# Patient Record
Sex: Female | Born: 1955 | Race: White | Hispanic: No | Smoking: Former smoker
Health system: Southern US, Community
[De-identification: ages and names within clinical notes are randomized; demographics above are authoritative.]

## PROBLEM LIST (undated history)

## (undated) DIAGNOSIS — N179 Acute kidney failure, unspecified: Secondary | ICD-10-CM

## (undated) DIAGNOSIS — I5031 Acute diastolic (congestive) heart failure: Secondary | ICD-10-CM

## (undated) DIAGNOSIS — R001 Bradycardia, unspecified: Secondary | ICD-10-CM

## (undated) DIAGNOSIS — I10 Essential (primary) hypertension: Secondary | ICD-10-CM

## (undated) DIAGNOSIS — F32A Depression, unspecified: Secondary | ICD-10-CM

## (undated) DIAGNOSIS — I4891 Unspecified atrial fibrillation: Secondary | ICD-10-CM

## (undated) DIAGNOSIS — F329 Major depressive disorder, single episode, unspecified: Secondary | ICD-10-CM

## (undated) HISTORY — DX: Unspecified atrial fibrillation: I48.91

## (undated) HISTORY — PX: BREAST LUMPECTOMY: SHX2

## (undated) HISTORY — PX: ABDOMINAL HYSTERECTOMY: SHX81

## (undated) HISTORY — PX: PACEMAKER IMPLANT: EP1218

## (undated) HISTORY — DX: Essential (primary) hypertension: I10

## (undated) HISTORY — DX: Acute diastolic (congestive) heart failure: I50.31

## (undated) HISTORY — DX: Major depressive disorder, single episode, unspecified: F32.9

## (undated) HISTORY — DX: Acute kidney failure, unspecified: N17.9

## (undated) HISTORY — DX: Bradycardia, unspecified: R00.1

## (undated) HISTORY — DX: Depression, unspecified: F32.A

---

## 2004-09-30 ENCOUNTER — Encounter: Admission: RE | Admit: 2004-09-30 | Discharge: 2004-09-30 | Payer: Self-pay | Admitting: Family Medicine

## 2010-05-04 DIAGNOSIS — I4891 Unspecified atrial fibrillation: Secondary | ICD-10-CM

## 2010-05-04 HISTORY — DX: Unspecified atrial fibrillation: I48.91

## 2010-05-25 ENCOUNTER — Encounter: Payer: Self-pay | Admitting: Family Medicine

## 2010-09-14 ENCOUNTER — Emergency Department (HOSPITAL_COMMUNITY)

## 2010-09-14 ENCOUNTER — Observation Stay (HOSPITAL_COMMUNITY)
Admission: AD | Admit: 2010-09-14 | Discharge: 2010-09-15 | Disposition: A | Discharge status: Home / Self Care | Source: Ambulatory Visit | Attending: Internal Medicine | Admitting: Internal Medicine

## 2010-09-14 DIAGNOSIS — I4891 Unspecified atrial fibrillation: Secondary | ICD-10-CM | POA: Insufficient documentation

## 2010-09-14 DIAGNOSIS — F329 Major depressive disorder, single episode, unspecified: Secondary | ICD-10-CM | POA: Insufficient documentation

## 2010-09-14 DIAGNOSIS — I498 Other specified cardiac arrhythmias: Secondary | ICD-10-CM | POA: Insufficient documentation

## 2010-09-14 DIAGNOSIS — R0789 Other chest pain: Principal | ICD-10-CM | POA: Insufficient documentation

## 2010-09-14 DIAGNOSIS — N179 Acute kidney failure, unspecified: Secondary | ICD-10-CM | POA: Insufficient documentation

## 2010-09-14 DIAGNOSIS — Z79899 Other long term (current) drug therapy: Secondary | ICD-10-CM | POA: Insufficient documentation

## 2010-09-14 DIAGNOSIS — R110 Nausea: Secondary | ICD-10-CM | POA: Insufficient documentation

## 2010-09-14 DIAGNOSIS — F3289 Other specified depressive episodes: Secondary | ICD-10-CM | POA: Insufficient documentation

## 2010-09-14 DIAGNOSIS — R0609 Other forms of dyspnea: Secondary | ICD-10-CM | POA: Insufficient documentation

## 2010-09-14 DIAGNOSIS — R0989 Other specified symptoms and signs involving the circulatory and respiratory systems: Secondary | ICD-10-CM | POA: Insufficient documentation

## 2010-09-14 LAB — PROTIME-INR
INR: 2.44 — ABNORMAL HIGH (ref 0.00–1.49)
Prothrombin Time: 26.6 seconds — ABNORMAL HIGH (ref 11.6–15.2)

## 2010-09-14 LAB — CBC
MCV: 94.8 fL (ref 78.0–100.0)
Platelets: 259 10*3/uL (ref 150–400)
RDW: 17.9 % — ABNORMAL HIGH (ref 11.5–15.5)
WBC: 8.2 10*3/uL (ref 4.0–10.5)

## 2010-09-14 LAB — APTT: aPTT: 37 seconds (ref 24–37)

## 2010-09-14 LAB — DIFFERENTIAL
Basophils Absolute: 0.1 10*3/uL (ref 0.0–0.1)
Basophils Relative: 1 % (ref 0–1)
Eosinophils Relative: 2 % (ref 0–5)
Lymphs Abs: 4.1 10*3/uL — ABNORMAL HIGH (ref 0.7–4.0)
Monocytes Relative: 7 % (ref 3–12)

## 2010-09-14 LAB — POCT CARDIAC MARKERS
CKMB, poc: 5.1 ng/mL (ref 1.0–8.0)
Troponin i, poc: <0.05 ng/mL (ref 0.00–0.09)

## 2010-09-14 LAB — PRO B NATRIURETIC PEPTIDE: Pro B Natriuretic peptide (BNP): 680.2 pg/mL — ABNORMAL HIGH (ref 0–125)

## 2010-09-15 DIAGNOSIS — I517 Cardiomegaly: Secondary | ICD-10-CM

## 2010-09-15 DIAGNOSIS — R079 Chest pain, unspecified: Secondary | ICD-10-CM

## 2010-09-15 DIAGNOSIS — I4891 Unspecified atrial fibrillation: Secondary | ICD-10-CM

## 2010-09-15 LAB — COMPREHENSIVE METABOLIC PANEL
ALT: 24 U/L (ref 0–35)
Albumin: 3.4 g/dL — ABNORMAL LOW (ref 3.5–5.2)
Alkaline Phosphatase: 54 U/L (ref 39–117)
Chloride: 105 mEq/L (ref 96–112)
Creatinine, Ser: 0.96 mg/dL (ref 0.4–1.2)
GFR calc non Af Amer: >60 mL/min (ref >60)
Potassium: 3.9 mEq/L (ref 3.5–5.1)
Sodium: 137 mEq/L (ref 135–145)
Total Bilirubin: 0.3 mg/dL (ref 0.3–1.2)
Total Protein: 6 g/dL (ref 6.0–8.3)

## 2010-09-15 LAB — BASIC METABOLIC PANEL
BUN: 24 mg/dL — ABNORMAL HIGH (ref 6–23)
Creatinine, Ser: 1.41 mg/dL — ABNORMAL HIGH (ref 0.4–1.2)
GFR calc Af Amer: 47 mL/min — ABNORMAL LOW (ref >60)
GFR calc non Af Amer: 39 mL/min — ABNORMAL LOW (ref >60)
Glucose, Bld: 125 mg/dL — ABNORMAL HIGH (ref 70–99)

## 2010-09-15 LAB — CARDIAC PANEL(CRET KIN+CKTOT+MB+TROPI)
CK, MB: 6.5 ng/mL (ref 0.3–4.0)
Total CK: 176 U/L (ref 7–177)
Troponin I: <0.3 ng/mL (ref <0.30)
Troponin I: <0.3 ng/mL (ref <0.30)

## 2010-09-15 LAB — LIPID PANEL
Cholesterol: 259 mg/dL — ABNORMAL HIGH (ref 0–200)
HDL: 77 mg/dL (ref >39)
LDL Cholesterol: 110 mg/dL — ABNORMAL HIGH (ref 0–99)
Total CHOL/HDL Ratio: 3.4 RATIO

## 2010-09-15 LAB — PROTIME-INR
INR: 3.03 — ABNORMAL HIGH (ref 0.00–1.49)
Prothrombin Time: 31.4 seconds — ABNORMAL HIGH (ref 11.6–15.2)

## 2010-09-15 LAB — T4, FREE: Free T4: 1.36 ng/dL (ref 0.80–1.80)

## 2010-09-15 LAB — TROPONIN I: Troponin I: <0.3 ng/mL (ref <0.30)

## 2010-09-15 LAB — T3, FREE: T3, Free: 2.7 pg/mL (ref 2.3–4.2)

## 2010-09-16 ENCOUNTER — Encounter: Payer: Self-pay | Admitting: Cardiology

## 2010-09-16 NOTE — Discharge Summary (Signed)
NAMESHAMARIE, CALL             ACCOUNT NO.:  [id number]  MEDICAL RECORD NO.:  [id number]           PATIENT TYPE:  O  LOCATION:  IC02                          FACILITY:  [facility]  PHYSICIAN:  Hillery Aldo, M.D.   DATE OF BIRTH:  06-13-1955  DATE OF ADMISSION:  09/14/2010 DATE OF DISCHARGE:  05/14/2012LH                              DISCHARGE SUMMARY   PRIMARY CARE PHYSICIAN:  Birdena Jubilee, MD  CARDIOLOGIST:  Dr. Chales Abrahams in [facility].  SECONDARY CARDIOLOGIST:  Gerrit Friends. Dietrich Pates, MD, [facility]  DISCHARGE DIAGNOSES: 1. Chest pain secondary to bradycardia. 2. History of paroxysmal atrial fibrillation. 3. Acute renal failure. 4. Hypotension. 5. History of hypertension. 6. Probable diastolic congestive heart failure precipitated by low     heart rate. 7. Depression.  DISCHARGE MEDICATIONS: 1. Warfarin 1 mg p.o. daily. 2. Artificial tears 1 drop in both eyes daily. 3. Enteric-coated aspirin 81 mg p.o. daily. 4. Cetirizine 10 mg p.o. daily p.r.n. allergy symptoms. 5. Lisinopril 10 mg p.o. daily. 6. Pristiq XR 50 mg p.o. daily.  Note:  The patient was instructed to discontinue diltiazem, amiodarone, and metoprolol.  CONSULTATIONS:  Dr. Hidalgo Bing of Cardiology.  BRIEF ADMISSION HISTORY OF PRESENT ILLNESS:  The patient is a 55 year old female with past medical history of paroxysmal atrial fibrillation diagnosed in January of this year who presented to the hospital with chest pain.  Pain came on when she was at rest and radiated up into the throat and jaw area.  There was some associated dyspnea, nausea, and emesis.  The pain was relieved by morphine.  According to the patient's husband, he took her pulse rate when she was having pain and it was in the 30s.  Upon initial evaluation in the emergency department, she was found to be in sinus bradycardia and there were some reports of a junctional rhythm by EMS.  She subsequently was referred to the hospitalist service for  further evaluation and treatment.  For the full details, please see the dictated report by Dr. Rito Ehrlich.  PROCEDURES AND DIAGNOSTIC STUDIES: 1. Chest x-ray on Sep 14, 2010, showed no active cardiopulmonary     disease. 2. Two-dimensional echocardiogram done on Sep 15, 2010:  Results     pending at the time of this dictation.  DISCHARGE LABORATORY VALUES:  Cardiac markers showed a nonspecific MB elevation.  The relative index was not greater than 5 and troponins were negative.  Sodium was 137, potassium 3.9, chloride 105, bicarb 26, BUN 20, creatinine 0.96, and glucose 113.  Total bilirubin 0.3, alkaline phosphatase 54, AST 22, ALT 24, total protein 6.0, albumin 3.4, and calcium 8.9.  PT was 31.4 with an INR of 3.03.  Pro BNP was 680.2.  HOSPITAL COURSE BY PROBLEM: 1. Chest pain secondary to bradycardia:  The patient's chest pain     episode appears to be due to bradycardia with possible transient     cardiac ischemia.  This was most likely induced by being on     multiple rate-controlling medications including calcium channel    blocker and a beta-blocker.  The patient was monitored closely on  telemetry and with holding her rate-controlling medications, her     heart rate improved into the 50s and 60s.  Cardiology had been     consulted by the admitting physician.  Dr. Dietrich Pates saw the patient     the morning after admission and cleared her for discharge with     recommendations to discontinue metoprolol, Cardizem, and     amiodarone.  He felt that she may not need further Coumadin if she     has a candidate for radiofrequency ablation and this would be     considered post discharge.  She was maintained on aspirin therapy.     At this point is chest pain-free. 2. History of paroxysmal atrial fibrillation:  The patient has been in     normal sinus rhythm and sinus bradycardia throughout her hospital     stay.  She was fully anticoagulated again, will follow up with     Cardiology  to determine for consideration of radiofrequency     ablation and discontinuation of Coumadin. 3. Acute renal failure, which was felt to be prerenal and her ACE     inhibitor was held.  Once her blood pressure improved, her     creatinine stabilized and was 0.96 at discharge. 4. Hypertension/hypotension:  The patient does have a history of     hypertension but was actually hypotensive on admission.  Several of     her medications have been discontinued and her blood pressure has     improved. 5. Probable diastolic congestive heart failure:  The patient's pro BNP     was elevated at 680 and she was transiently short of breath.  Her     chest x-ray was clear.  It is most likely diastolic heart failure     precipitated by low heart rate.  She is compensated now. 6. Depression:  The patient can continue Pristiq post discharge.  DISPOSITION:  The patient is medically stable and has been cleared for discharge by the cardiologist.  CONDITION AT DISCHARGE:  Improved.  DISCHARGE DIET:  Heart healthy.  DISCHARGE INSTRUCTIONS:  Follow up with Dr. Dietrich Pates in 1 week.  Follow up with your primary care physician for ongoing Coumadin management with a repeat PT/INR in the next 2-3 days.  Time spent coordinating care for discharge and discharge instructions including face-to-face time equals approximately 35 minutes.     Hillery Aldo, M.D.     CR/MEDQ  D:  09/15/2010  T:  09/16/2010  Job:  [id number]  cc:   Birdena Jubilee, MD Fax: [phone]  Dr. Ida Rogue  Gerrit Friends. Dietrich Pates, MD, [facility] [address] [place], [place] [address]  Electronically Signed by Hillery Aldo M.D. on 09/16/2010 04:42:07 PM

## 2010-09-19 ENCOUNTER — Encounter: Payer: Self-pay | Admitting: Cardiology

## 2010-09-22 ENCOUNTER — Ambulatory Visit (INDEPENDENT_AMBULATORY_CARE_PROVIDER_SITE_OTHER): Payer: Self-pay | Admitting: Adult Health

## 2010-09-22 ENCOUNTER — Encounter: Payer: Self-pay | Admitting: Adult Health

## 2010-09-22 VITALS — BP 134/84 | HR 71 | Wt 159.0 lb

## 2010-09-22 DIAGNOSIS — E7800 Pure hypercholesterolemia, unspecified: Secondary | ICD-10-CM | POA: Insufficient documentation

## 2010-09-22 DIAGNOSIS — I480 Paroxysmal atrial fibrillation: Secondary | ICD-10-CM | POA: Insufficient documentation

## 2010-09-22 DIAGNOSIS — I498 Other specified cardiac arrhythmias: Secondary | ICD-10-CM

## 2010-09-22 DIAGNOSIS — R001 Bradycardia, unspecified: Secondary | ICD-10-CM

## 2010-09-22 DIAGNOSIS — I4891 Unspecified atrial fibrillation: Secondary | ICD-10-CM

## 2010-09-22 DIAGNOSIS — I10 Essential (primary) hypertension: Secondary | ICD-10-CM

## 2010-09-22 MED ORDER — DILTIAZEM HCL ER COATED BEADS 120 MG PO CP24: 120.0000 mg | ORAL_CAPSULE | Freq: Every day | ORAL | Status: DC

## 2010-09-22 MED ORDER — DILTIAZEM HCL 120 MG PO TABS: 120.0000 mg | ORAL_TABLET | Freq: Every day | ORAL | Status: DC

## 2010-09-22 MED ORDER — LISINOPRIL 20 MG PO TABS: 20.0000 mg | ORAL_TABLET | Freq: Every day | ORAL | Status: DC

## 2010-09-22 NOTE — Patient Instructions (Signed)
Your physician is referring you to Dr. Myrna Blazer at [facility].  Your physician has recommended you make the following change in your medication: Start taking Diltiazem 120mg  daily and increase Lisinopril to 20mg  daily

## 2010-09-22 NOTE — Assessment & Plan Note (Signed)
At this time, she is well controlled. She will need close follow-up with BP management with addition of cardiazem and lisinopril dose changes.

## 2010-09-22 NOTE — Assessment & Plan Note (Signed)
She has been seen and examined by myself and by Dr. Dietrich Pates.  Unfortunately her insurance is not accepted by our practice-[employer].  She has received a letter that states Dr.Mihai Croitoru of [facility].  She is being referred there for continuation of her cardiac care,as she wishes to be treated locally and not return to [facility].    She will need to have EP evaluation once amiodarone is out of her system for evaluation of atrial fib and discussion of need to have ablation if necessary.  At this time she will be placed on cardiazem 120mg  daily, lisinopril will be increased to 20mg  daily.  She is to continue on coumadin as directed.  No BB at this time.  This has been discussed with the patient at length by Dr. Dietrich Pates.  She verbalizes understanding.

## 2010-09-22 NOTE — Progress Notes (Signed)
HPI: Patricia Sellers is a pleasant 55 y/o CF patient of Dr. Dietrich Pates who saw her orginally on consultation at [facility] for sinus bradycardia with history of paroxysmal atrial fib.  She is normally seen by Dr. Dyane Dustman in [facility] and has a history of PAF, hypertension, and depression.  She was on amiodarone, diltiazem, metoprolol and coumadin.  She was admitted with a HR of 40 with hypotension 80's over 40's.  During hospitalization, she was taken off of amiodarone, metoprolol and diltiazem.  HR improved off of these medications. She remains on coumadin and is followed by PCP Dr.Robyn Zanard for PT/INR.  She is basically asymptomatic since discharge, but continues some fatigue. She has had one or two bouts of fluttering in her heart, but nonsustained.  She denies chest pain or dizziness.    Allergies  Allergen Reactions  . Benzoyl Peroxide Rash    Current Outpatient Prescriptions  Medication Sig Dispense Refill  . aspirin 81 MG tablet Take 81 mg by mouth daily.        . Cetirizine HCl 10 MG CAPS Take 1 capsule by mouth daily.        Marland Kitchen desvenlafaxine (PRISTIQ) 50 MG 24 hr tablet Take 50 mg by mouth daily.        Marland Kitchen diltiazem (CARDIZEM CD) 120 MG 24 hr capsule Take 1 capsule (120 mg total) by mouth daily.  30 capsule  3  . diltiazem (CARDIZEM) 120 MG tablet Take 1 tablet (120 mg total) by mouth daily.  30 tablet  3  . hydroxypropyl methylcellulose (ISOPTO TEARS) 2.5 % ophthalmic solution Place into both eyes 3 (three) times daily.        Marland Kitchen lisinopril (PRINIVIL,ZESTRIL) 20 MG tablet Take 1 tablet (20 mg total) by mouth daily.  30 tablet  3  . warfarin (COUMADIN) 1 MG tablet Take 1 mg by mouth as directed.        Marland Kitchen DISCONTD: lisinopril (PRINIVIL,ZESTRIL) 10 MG tablet Take 10 mg by mouth daily.          Past Medical History  Diagnosis Date  . Atrial fibrillation 05/2010    Dr. Chales Abrahams at [facility]; Echo-mild to moderate MR, mild LVH,  moderate left atrial enlargement, atrial septal aneurysm,  negative contrast study, EF of 35%.normal chemistry profile, TSH, chest x-ray, cardiac markers, electrolytes and renal tests.  Normal cardiac catheterization except for depressed LV function .  A sleeve a  . Chest pain   . Bradycardia   . Acute renal failure   . Hypotension   . Hypertension   . Diastolic CHF, acute   . Depression     Past Surgical History  Procedure Date  . Abdominal hysterectomy   . Breast lumpectomy     benign    ZOX:[id number] of systems complete and found to be negative unless listed above PHYSICAL EXAM BP 134/84  Pulse 71  Wt 159 lb (72.122 kg) General: Well developed, well nourished, in no acute distress Head: Eyes PERRLA, No xanthomas.   Normal cephalic and atramatic  Lungs: Clear bilaterally to auscultation and percussion. Heart: HRRR S1 S2.  Pulses are 2+ & equal.            No carotid bruit. No JVD.  No abdominal bruits. No femoral bruits. Abdomen: Bowel sounds are positive, abdomen soft and non-tender without masses or                  Hernia's noted. Msk:  Back normal, normal gait.  Normal strength and tone for age. Extremities: No clubbing, cyanosis or edema.  DP +1 Neuro: Alert and oriented X 3. Psych:  Good affect, responds appropriately EKG: NSR with possible LAE.  Nonspecific ST and T wave abnormality.  Prolonged QT of 450 ms. ASSESSMENT AND PLAN

## 2010-10-09 NOTE — H&P (Signed)
NAMEALAZNE, Patricia Sellers             ACCOUNT NO.:  [id number]  MEDICAL RECORD NO.:  [id number]           PATIENT TYPE:  LOCATION:                                 FACILITY:  PHYSICIAN:  Osvaldo Shipper, MD     DATE OF BIRTH:  07-06-1955  DATE OF ADMISSION:  09/15/2010 DATE OF DISCHARGE:  LH                             HISTORY & PHYSICAL   PRIMARY CARE PHYSICIAN:  Birdena Jubilee, MD, located in [place].  PRIMARY CARDIOLOGIST:  Dr. Chales Abrahams in [facility].  ADMISSION DIAGNOSES: 1. Chest pain, rule out acute coronary syndrome. 2. History of atrial fibrillation, diagnosed in January. 3. Bradycardia, predominately sinus. 4. History of hypertension. 5. Dehydration with mild prerenal azotemia.  CHIEF COMPLAINT:  Chest pain.  HISTORY OF PRESENT ILLNESS:  The patient is a 56 year old Caucasian female who has a past medical history of AFib which was diagnosed in January who presented to the hospital with the complaints of chest pain which started at around 8:45 in the evening.  The patient was at rest when the pain started lying on the bed.  The pain was in the left side of the chest and it started radiating up to the throat and the jaw area. She got a little bit short of breath, had nausea, and then had two episodes of emesis.  The pain was a dull ache, tightness which was 8/10 in intensity.  After she received morphine, the pain has decreased to 0/10.  She denies any dizziness or lightheadedness.  No fever, no cough. She did feel a little bit hot and sweaty.  Most of the symptoms have resolved at this time.  She tells me that she was diagnosed with AFib in January, was admitted for a week at [facility].  She had a cardiac cath at that time which was clean.  It sounds like she also had an echocardiogram which showed enlarged heart, it is unclear if the heart squeezes well.  The plan is for possible cardioversion or a maze procedure or ablation sometime in the  future.  MEDICATIONS AT HOME: 1. Lisinopril 10 mg daily. 2. Diltiazem 240 mg daily. 3. Amiodarone 200 mg daily. 4. Coumadin 5 mg daily. 5. Metoprolol 50 mg twice daily. 6. Pristiq 50 mg daily. 7. Zyrtec as needed. 8. Aspirin 81 mg daily. 9. Tylenol Extra Strength as needed.  ALLERGIES:  BENZOYL PEROXIDE which causes a rash.  PAST MEDICAL HISTORY:  Positive for hypertension and recently diagnosed AFib as mentioned above.  SURGICAL HISTORY: 1. Hysterectomy. 2. Lumpectomy from her breast which was benign.  SOCIAL HISTORY:  Lives in [place] with her husband, recently moved here from [place].  Does not work, does not smoke cigarettes and drinks two drinks of Vodka Artic at least four times a week.  Her last consumption of alcohol was last night.  FAMILY HISTORY:  Father has a history of heart attack.  Mother has history of heart disease.  REVIEW OF SYSTEMS:  GENERAL:  Positive for weakness.  HEENT: Unremarkable.  CARDIOVASCULAR:  As in HPI.  RESPIRATORY:  As in HPI. GASTROINTESTINAL:  Unremarkable.  GENITOURINARY:  Unremarkable.  NEUROLOGIC:  Unremarkable.  PSYCHIATRIC:  Unremarkable.  DERMATOLOGIC: Unremarkable. Other systems reviewed and found to be negative.  PHYSICAL EXAMINATION:  VITAL SIGNS:  Temperature 97.4; blood pressures have been running soft in 90s over 50s, 80s over 40s; heart rate initially was in the 40s, currently in the 50s, sinus; respiratory rate is 16; saturation 100% on room air. GENERAL:  A slightly overweight white female, in no distress, slightly anxious. HEENT:  Head is normocephalic, atraumatic.  Pupils are equal, reacting. No pallor, no icterus.  Oral mucous membranes moist.  No oral lesions are noted. NECK:  Soft and supple.  No thyromegaly is appreciated. LYMPHATIC:  No cervical, supraclavicular, inguinal lymphadenopathy is present. LUNGS:  Clear to auscultation bilaterally with no wheezing, rales, or rhonchi. CARDIOVASCULAR:  S1 and S2  are bradycardic, regular.  No S3, S4, rubs, murmurs, or bruits.  No JVD.  No pedal edema. ABDOMEN:  Soft, nontender, nondistended.  Bowel sounds are present.  No masses or organomegaly is appreciated. GENITOURINARY:  Deferred. MUSCULOSKELETAL:  Normal muscle mass and tone.  No calf tenderness.  No erythema over the lower extremities. NEUROLOGIC:  She is alert, oriented x3.  No focal neurological deficits are present.  LABORATORY DATA:  Her CBC is unremarkable.  Her INR is 2.4.  Her BUN is 24, creatinine is 1.4, glucose is 125.  Electrolytes otherwise are normal.  Total CK is 273, CK-MB is 9.5, troponin less than 0.3, relative index is 3.5.  BNP was 680.  Chest x-ray was done which showed upper normal heart size, no active cardiopulmonary disease. This particular EKG is read as junctional bradycardia.  On the monitor, the patient appears to have sinus bradycardia, no ST-T wave changes are noted on this EKG except for T-wave inversion in leads II and III.  We will have the EKG repeated when the patient is on the floor.  ASSESSMENT:  This is a 55 year old Caucasian female who presents with chest pain.  She has a history of paroxysmal atrial fibrillation which was diagnosed in January.  It looks like she had a negative coronary workup in January.  The reason for the chest pain could be angina, could be related to bradycardia.  It is unlikely to be thromboembolic.  Could be related to alcohol consumption although she denies any history of heartburns.  PLAN: 1. Chest pain.  We will admit her to telemetry, rule her out for acute     coronary syndrome with serial cardiac enzymes.  Repeat EKGs and we     will get reports of her cardiac cath and echocardiogram from [facility]. 2. Bradycardia appears to be sinus at this time.  She is possibly on     too many medications, which explains slow heart rate.  We will for     now hold off on the diltiazem and the metoprolol.  We will  just     continue with amiodarone.  Once her heart rate stabilizes, she may     benefit just from a low dose metoprolol as long as she is in sinus     rhythm and as long as she is in sinus rhythm, the calcium channel     blocker can be safely discontinued. 3. Mild prerenal azotemia will be treated with IV fluids and labs will     be repeated. 4. Continue with Coumadin for anticoagulation.  At this point, we will hold off any consultation until we get all the reports and then  see what the blood work shows.  The patient tells me that she has an appointment with Dr. Chales Abrahams on the afternoon of Sep 15, 2010.  I have told them that it is quite unlikely she will make that appointment, so she should probably reschedule for later this week.  Further management decisions will depend on results of further testing and patient's response to treatment.  Osvaldo Shipper, MD     GK/MEDQ  D:  09/15/2010  T:  09/15/2010  Job:  [id number]  cc:   Birdena Jubilee, MD Fax: [phone]  Dr. Chales Abrahams in [facility]  Electronically Signed by Osvaldo Shipper MD on 10/09/2010 10:17:44 PM

## 2010-10-13 ENCOUNTER — Encounter: Payer: Self-pay | Admitting: Adult Health

## 2010-11-03 NOTE — Consult Note (Signed)
NAMEGOLDEN, EMILE             ACCOUNT NO.:  [id number]  MEDICAL RECORD NO.:  [id number]           PATIENT TYPE:  LOCATION:                                [facility]:  [facility]  PHYSICIAN:  Gerrit Friends. Dietrich Pates, MD, FACCDATE OF BIRTH:  04-21-1956  DATE OF CONSULTATION:  09/15/2010 DATE OF DISCHARGE:  09/15/2010                                CONSULTATION   PRIMARY CARE PHYSICIAN:  Dr. Alberteen Sam at [facility].  PRIMARY CARDIOLOGIST:  Dr. Dyane Dustman at [facility].  REQUESTING PHYSICIAN:  [facility], Dr. Darnelle Catalan  REASON FOR CONSULTATION:  Chest pain with history of atrial fib.  HISTORY OF PRESENT ILLNESS:  This is a 55 year old Caucasian female with known history of atrial fibrillation seen in [place] by Dr. Dyane Dustman.  Cath in January 2012 revealed normal coronary anatomy, EF was 35%, and elective cardioversion was planned but never attempted.  The patient has recently moved to [facility] and would like to transfer care to Dr. [place] Bing.  Postprandial emesis occurred yesterday evening associated with chest discomfort and dyspnea.  Pain lasted approximately an hour and a half and radiated to the jaw bilaterally. On arrival to the emergency room, the patient was given aspirin.  Blood pressure was 91/55 with a heart rate of 40.  Morphine cause mild hypotension that responded to the fluids.  She has occasionally reported chest pressure with minimal to radiation jaw since admission. She has had sinus bradycardia in the low 40s and a5-minute episode of atrial flutter.   REVIEW OF SYSTEMS:  Positive for chest pain, shortness of breath, choky feeling, nausea and vomiting.  All other systems are reviewed and found to be negative.  Code status is full code.  PAST MEDICAL HISTORY:  Atrial fibrillation diagnosed in January 2012, bradycardiac diagnosed in January 2012, hypertension diagnosed in January 2012, dehydration with prerenal azotemia and during that hospitalization in  January 2012.  Most recent LVEF recorded for cardiac catheterization was ER of 35% in the setting of atrial fib with a heart rate of 180 beats per minute.  PAST SURGICAL HISTORY:  Hysterectomy and benign breast lumpectomy.  SOCIAL HISTORY:  She lives in [place] with her husband.  She is married.  She does not smoke.  She drinks vodka several times a week. Denies use of drugs.  FAMILY HISTORY:  Mother with CAD deceased from CVA.  Father deceased from prostate cancer with a history of an MI.  He has a brother with a three-vessel coronary artery bypass graft.  MEDICATIONS PRIOR TO ADMISSION: 1. Lisinopril 10 mg daily. 2. Diltiazem 240 mg daily. 3. Amiodarone 200 mg daily. 4. Coumadin 5 mg daily. 5. Metoprolol 50 mg b.i.d. 6. Pristiq 50 mg daily. 7. Zyrtec p.r.n. 8. Aspirin 81 mg daily. 9. Extra Strength Tylenol p.r.n.  ALLERGIES:  PEROXIDE causing a rash.  LABORATORY DATA:  Current labs, sodium 137, potassium 3.9, chloride 105, CO2 of 26, BUN of 20, creatinine 0.96, glucose of 113, hemoglobin of 13.8, hematocrit of 42.2, white blood cells of 8.2, platelets of 259, total bili 0.3, alkaline phosphatase 54, AST 22, ALT 24, total protein 6.0, albumin  3.4.  BNP 680, troponin less than 0.30 and less than 0.30 respectively.  PT 31.4 and INR 3.0.  EKG revealing sinus bradycardia with a rate of 56 beats per minute. Telemetry reveals 5 minutes of atrial flutter with controlled heart rate.  She did have some bradycardia noted as well.  RADIOLOGY:  No active cardiopulmonary disease.  PHYSICAL EXAMINATION:  VITAL SIGNS:  Blood pressure 85/66, pulse 56, respirations 18, temperature 98.8, and O2 sat 97% on 2 liters. GENERAL:  She is awake, alert, oriented in no acute distress. HEENT:  Head is normocephalic and atraumatic.  EYES:  PERRLA. NECK:  Supple without JVD or carotid bruits. CARDIOVASCULAR:  Irregular rhythm, bradycardic with no rubs, murmurs, or gallops.  Pulses are 2+ and  equal without bruits. LUNGS:  Clear to auscultation without wheezes, rales, or rhonchi. ABDOMEN:  Soft, nontender with 2+ bowel sounds. EXTREMITIES:  Without clubbing, cyanosis or edema.  There is no joint deformity. NEURO:  Cranial nerves II through XII are grossly intact.  IMPRESSION: 1. Chest pain with no changes in cardiac enzymes.  EKG is negative for     ischemia.  The patient received morphine and nitroglycerin in the     ER.  Chest pain is associated with decrease heart rate described     as pressure. 2. Bradycardia with history of atrial fibrillation and a CHADS score of 2.     She has been treated with diltiazem, metoprolol, amiodarone, and     Coumadin.  Medical therapy will require adjustment to allow heart rate to increase. 3. Atrial fibrillation by history.  She continues on Coumadin adjusted by Dr. Chales Abrahams.       She may be discharged all medications with the exception of Coumadin which she may not     need either if her only arrhythmia is flutter.  RFA may ultimately be a permanent solution for her.       Rate control medications can be reintroduced as needed.  Hypertension has not been documented in hospital     We will measure TSH, stop amiodarone and perform echocardiogram to exclude structural heart disease.     We will plan to see her in our office in approximately 1 week.     Bettey Mare. Lyman Bishop, NP   ______________________________ Gerrit Friends. Dietrich Pates, MD, [facility]    KML/MEDQ  D:  09/15/2010  T:  09/15/2010  Job:  [id number]  cc:   Dr. Williemae Natter [facility]  Electronically Signed by Joni Reining NP on 09/23/2010 10:38:20 AM Electronically Signed by Waukee Bing MD [facility] on 11/03/2010 04:35:16 PM

## 2012-07-01 ENCOUNTER — Emergency Department (HOSPITAL_COMMUNITY)
Admission: EM | Admit: 2012-07-01 | Discharge: 2012-07-01 | Disposition: A | Discharge status: Other Acute Inpatient Hospital | Attending: Emergency Medicine | Admitting: Emergency Medicine

## 2012-07-01 ENCOUNTER — Encounter (HOSPITAL_COMMUNITY): Payer: Self-pay

## 2012-07-01 ENCOUNTER — Emergency Department (HOSPITAL_COMMUNITY)

## 2012-07-01 DIAGNOSIS — F3289 Other specified depressive episodes: Secondary | ICD-10-CM | POA: Insufficient documentation

## 2012-07-01 DIAGNOSIS — F329 Major depressive disorder, single episode, unspecified: Secondary | ICD-10-CM | POA: Insufficient documentation

## 2012-07-01 DIAGNOSIS — Z87448 Personal history of other diseases of urinary system: Secondary | ICD-10-CM | POA: Insufficient documentation

## 2012-07-01 DIAGNOSIS — I10 Essential (primary) hypertension: Secondary | ICD-10-CM | POA: Insufficient documentation

## 2012-07-01 DIAGNOSIS — I5031 Acute diastolic (congestive) heart failure: Secondary | ICD-10-CM | POA: Insufficient documentation

## 2012-07-01 DIAGNOSIS — Z7982 Long term (current) use of aspirin: Secondary | ICD-10-CM | POA: Insufficient documentation

## 2012-07-01 DIAGNOSIS — I4892 Unspecified atrial flutter: Secondary | ICD-10-CM | POA: Insufficient documentation

## 2012-07-01 DIAGNOSIS — Z79899 Other long term (current) drug therapy: Secondary | ICD-10-CM | POA: Insufficient documentation

## 2012-07-01 LAB — CBC WITH DIFFERENTIAL/PLATELET
Basophils Absolute: 0 10*3/uL (ref 0.0–0.1)
Basophils Relative: 1 % (ref 0–1)
Lymphocytes Relative: 37 % (ref 12–46)
MCHC: 33.2 g/dL (ref 30.0–36.0)
Neutro Abs: 2.1 10*3/uL (ref 1.7–7.7)
Platelets: 222 10*3/uL (ref 150–400)
RDW: 13.2 % (ref 11.5–15.5)
WBC: 4.2 10*3/uL (ref 4.0–10.5)

## 2012-07-01 LAB — BASIC METABOLIC PANEL
BUN: 9 mg/dL (ref 6–23)
Calcium: 10.1 mg/dL (ref 8.4–10.5)
Creatinine, Ser: 0.92 mg/dL (ref 0.50–1.10)
GFR calc Af Amer: 80 mL/min — ABNORMAL LOW (ref >90)
GFR calc non Af Amer: 69 mL/min — ABNORMAL LOW (ref >90)

## 2012-07-01 LAB — APTT: aPTT: 53 seconds — ABNORMAL HIGH (ref 24–37)

## 2012-07-01 LAB — PROTIME-INR: INR: 1.78 — ABNORMAL HIGH (ref 0.00–1.49)

## 2012-07-01 LAB — TROPONIN I: Troponin I: <0.3 ng/mL (ref <0.30)

## 2012-07-01 MED ORDER — SODIUM CHLORIDE 0.9 % IV SOLN
Freq: Once | INTRAVENOUS | Status: AC
  Administered 2012-07-01: 11:00:00 via INTRAVENOUS

## 2012-07-01 NOTE — ED Notes (Addendum)
Pt presents to er with c/o "feeling weak, like she is going to pass out", pt states that she started feeling weak this am, felt like her heart was beating slow., checked her heartbeat on a machine at home, was in 30's, felt a sharp pinch in left side of her chest, states "I think my pacemaker kicked in", called ems, pt refused transport by ems because she felt better, feeling of weakness returned and pt decided to come to er for further evaluation, pt on cardiac monitor, rhythm sinus tach at present, Dr. 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 medtronic machine placed on pacemaker, information transmitted, fax received and given to Dr. Hyacinth Meeker.  Pt and spouse report that pt has been on a diet over the past few weeks, denies any use of medication for diet purpose.

## 2012-07-01 NOTE — ED Notes (Addendum)
Per Dr. Deretha Emory pt can have something to eat, diet tray ordered, pt updated on delay

## 2012-07-01 NOTE — ED Provider Notes (Signed)
History     CSN: [id number]  Arrival date & time 07/01/12  1020   First MD Initiated Contact with Patient 07/01/12 1030      Chief Complaint  Patient presents with  . Irregular Heart Beat    (Consider location/radiation/quality/duration/timing/severity/associated sxs/prior treatment) HPI Comments: Pt with hx of Bradycardia which has been symptomatic in the past who required pacer placement in the last year presents with "feeling bad" and states that she felt like her heart was going slow - she checked her pulse on her home monitor and stated it was 35 - she then felt like it was racing.  EMS arrived and found pt to have pulse of 115 in irregular rhythm, and transported.  The sx were acute in onset, intermittent, currently feeling much better, not associated with SOB ro CP, cough, fevers, n/v/d but was associated with near syncope.    She denies taking any new medications, over-the-counter medications or any increase in caffeine or stimulant intake. No alcohol tobacco or cocaine use. She has started a new diet but this is primarily portion control and not associated with medications.  The history is provided by the patient.    Past Medical History  Diagnosis Date  . Atrial fibrillation 05/2010    Dr. Chales Abrahams at [facility]; Echo-mild to moderate MR, mild LVH,  moderate left atrial enlargement, atrial septal aneurysm, negative contrast study, EF of 35%.normal chemistry profile, TSH, chest x-ray, cardiac markers, electrolytes and renal tests.  Normal cardiac catheterization except for depressed LV function .  A sleeve a  . Chest pain   . Bradycardia   . Acute renal failure   . Hypotension   . Hypertension   . Diastolic CHF, acute   . Depression     Past Surgical History  Procedure Laterality Date  . Abdominal hysterectomy    . Breast lumpectomy      benign    No family history on file.  History  Substance Use Topics  . Smoking status: Never Smoker   . Smokeless  tobacco: Never Used  . Alcohol Use: Yes    OB History   Grav Para Term Preterm Abortions TAB SAB Ect Mult Living                  Review of Systems  All other systems reviewed and are negative.    Allergies  Benzoyl peroxide  Home Medications   Current Outpatient Rx  Name  Route  Sig  Dispense  Refill  . aspirin EC 81 MG tablet   Oral   Take 81 mg by mouth daily.         Marland Kitchen buPROPion (WELLBUTRIN XL) 300 MG 24 hr tablet   Oral   Take 300 mg by mouth daily.         . flecainide (TAMBOCOR) 100 MG tablet   Oral   Take 100 mg by mouth 2 (two) times daily.         . metoprolol succinate (TOPROL-XL) 25 MG 24 hr tablet   Oral   Take 25 mg by mouth daily.         . metoprolol tartrate (LOPRESSOR) 25 MG tablet   Oral   Take 25 mg by mouth daily.         . pravastatin (PRAVACHOL) 40 MG tablet   Oral   Take 40 mg by mouth daily.         . Rivaroxaban (XARELTO) 20 MG TABS   Oral  Take 20 mg by mouth daily.         . Vitamin D, Ergocalciferol, (DRISDOL) 50000 UNITS CAPS   Oral   Take 50,000 Units by mouth 2 (two) times a week. Tuesday and Thursday         . hydroxypropyl methylcellulose (ISOPTO TEARS) 2.5 % ophthalmic solution   Both Eyes   Place into both eyes 3 (three) times daily.             BP 108/91  Pulse 109  Temp(Src) 97.4 F (36.3 C) (Oral)  Resp 18  SpO2 99%  Physical Exam  Nursing note and vitals reviewed. Constitutional: She appears well-developed and well-nourished. No distress.  HENT:  Head: Normocephalic and atraumatic.  Mouth/Throat: Oropharynx is clear and moist. No oropharyngeal exudate.  Eyes: Conjunctivae and EOM are normal. Pupils are equal, round, and reactive to light. Right eye exhibits no discharge. Left eye exhibits no discharge. No scleral icterus.  Neck: Normal range of motion. Neck supple. No JVD present. No thyromegaly present.  Cardiovascular: Normal heart sounds and intact distal pulses.  Exam reveals no  gallop and no friction rub.   No murmur heard. Irregularly irregular rhythm, slight tachycardia to 105, has intermittent rate increases to 120 that last a minute and then resolve back to 100.  Pulmonary/Chest: Effort normal and breath sounds normal. No respiratory distress. She has no wheezes. She has no rales.  Abdominal: Soft. Bowel sounds are normal. She exhibits no distension and no mass. There is no tenderness.  Musculoskeletal: Normal range of motion. She exhibits no edema and no tenderness.  Lymphadenopathy:    She has no cervical adenopathy.  Neurological: She is alert. Coordination normal.  Skin: Skin is warm and dry. No rash noted. No erythema.  Psychiatric: She has a normal mood and affect. Her behavior is normal.    ED Course  Procedures (including critical care time)  Labs Reviewed  BASIC METABOLIC PANEL - Abnormal; Notable for the following:    Glucose, Bld 102 (*)    GFR calc non Af Amer 69 (*)    GFR calc Af Amer 80 (*)    All other components within normal limits  CBC WITH DIFFERENTIAL - Abnormal; Notable for the following:    RBC 5.24 (*)    Hemoglobin 15.7 (*)    HCT 47.3 (*)    All other components within normal limits  APTT - Abnormal; Notable for the following:    aPTT 53 (*)    All other components within normal limits  PROTIME-INR - Abnormal; Notable for the following:    Prothrombin Time 20.1 (*)    INR 1.78 (*)    All other components within normal limits  TROPONIN I   Dg Chest Port 1 View  07/01/2012  *RADIOLOGY REPORT*  Clinical Data: Chest pain.  PORTABLE CHEST - 1 VIEW  Comparison: 09/14/2010  Findings: Heart size and pulmonary vascularity are normal and the lungs are clear. No osseous abnormality. Dual lead pacer in place.  IMPRESSION: No acute abnormality.   Original Report Authenticated By: Francene Boyers, M.D.      1. Atrial flutter       MDM  Overall the patient appears hemodynamically stable though she has intermittent tachycardias.  Her EKG here shows a what appears to be atrial flutter with variable conduction or atrial fibrillation. He does not appear consistent with a sinus rhythm. She is anticoagulated with oral anticoagulants, she has arty taken a beta blocker this morning both  her long acting and her short-acting metoprolol. She will need pacemaker interrogation, electrolyte evaluation and ischemia evaluation.   ED ECG REPORT  I personally interpreted this EKG   Date: 07/01/2012   Rate: 105  Rhythm: Atrial tachycardia consistent with atrial fibrillation with rapid ventricular rate or atrial flutter with variable block  QRS Axis: normal  Intervals: normal  ST/T Wave abnormalities: nonspecific T wave changes  Conduction Disutrbances:none  Narrative Interpretation:   Old EKG Reviewed: Compared with 09/15/2010, sinus bradycardia has been replaced with an atrial tachycardia    According to [employer], she has had ventricular rates from 140's to 220's today.    I discussed the patient's care with Dr. Dory Peru of cardiology at [facility], he recommends that the patient be transferred to their facility for an EP study and possible cardioversion. I believe that this is in the best interest of the patient at this time, she does not appear to be hemodynamically unstable, I discussed this with the patient was in agreement. Labs did not show any acute abnormalities.  Vida Roller, MD 07/01/12 1240

## 2012-07-01 NOTE — ED Notes (Signed)
Pt ambulatory to restroom, had bowel movement, immediatly after bowel movement, pt states "I blacked out", pt does not remember being assisted back to bed, hr 200 on monitor, lasted for a few seconds then returned to 100-110, states that she feels better now.

## 2012-07-01 NOTE — ED Notes (Signed)
Contacted [facility] bed placement, advised that pt is still pending bed placement, pt and family updated on delay.

## 2012-07-01 NOTE — ED Notes (Signed)
Pt felt like heart was beating too fast and irregular. Called ems, was checked out and told to come to er for eval. She has a pacemaker. And has taken am meds.

## 2012-07-01 NOTE — ED Notes (Signed)
Per [facility] transfer center, pt is still pending bed assignment, advised that they will call with bed assignment when one is available.

## 2012-07-01 NOTE — ED Notes (Signed)
Spoke with Duwayne Heck in bed placement at [facility] who advised that they had a bed for pt, Room number 3706. [facility] contacted for transport. Pt and family updated

## 2012-07-01 NOTE — ED Notes (Signed)
Dr Miller at bedside. 

## 2012-07-01 NOTE — ED Notes (Signed)
Pt given dinner tray,  

## 2012-07-01 NOTE — ED Notes (Signed)
Dr. Hyacinth Meeker speaking with pt and family

## 2012-07-01 NOTE — ED Notes (Signed)
Pt waiting on bed assignment for [facility], pt and family updated on delay

## 2012-11-26 IMAGING — CR DG CHEST 1V PORT
1 series · 1 of 1 positions shown · non-contrast
Comparison: None

CLINICAL DATA: Chest pain and short of breath

PORTABLE CHEST - 1 VIEW

[view not recorded]
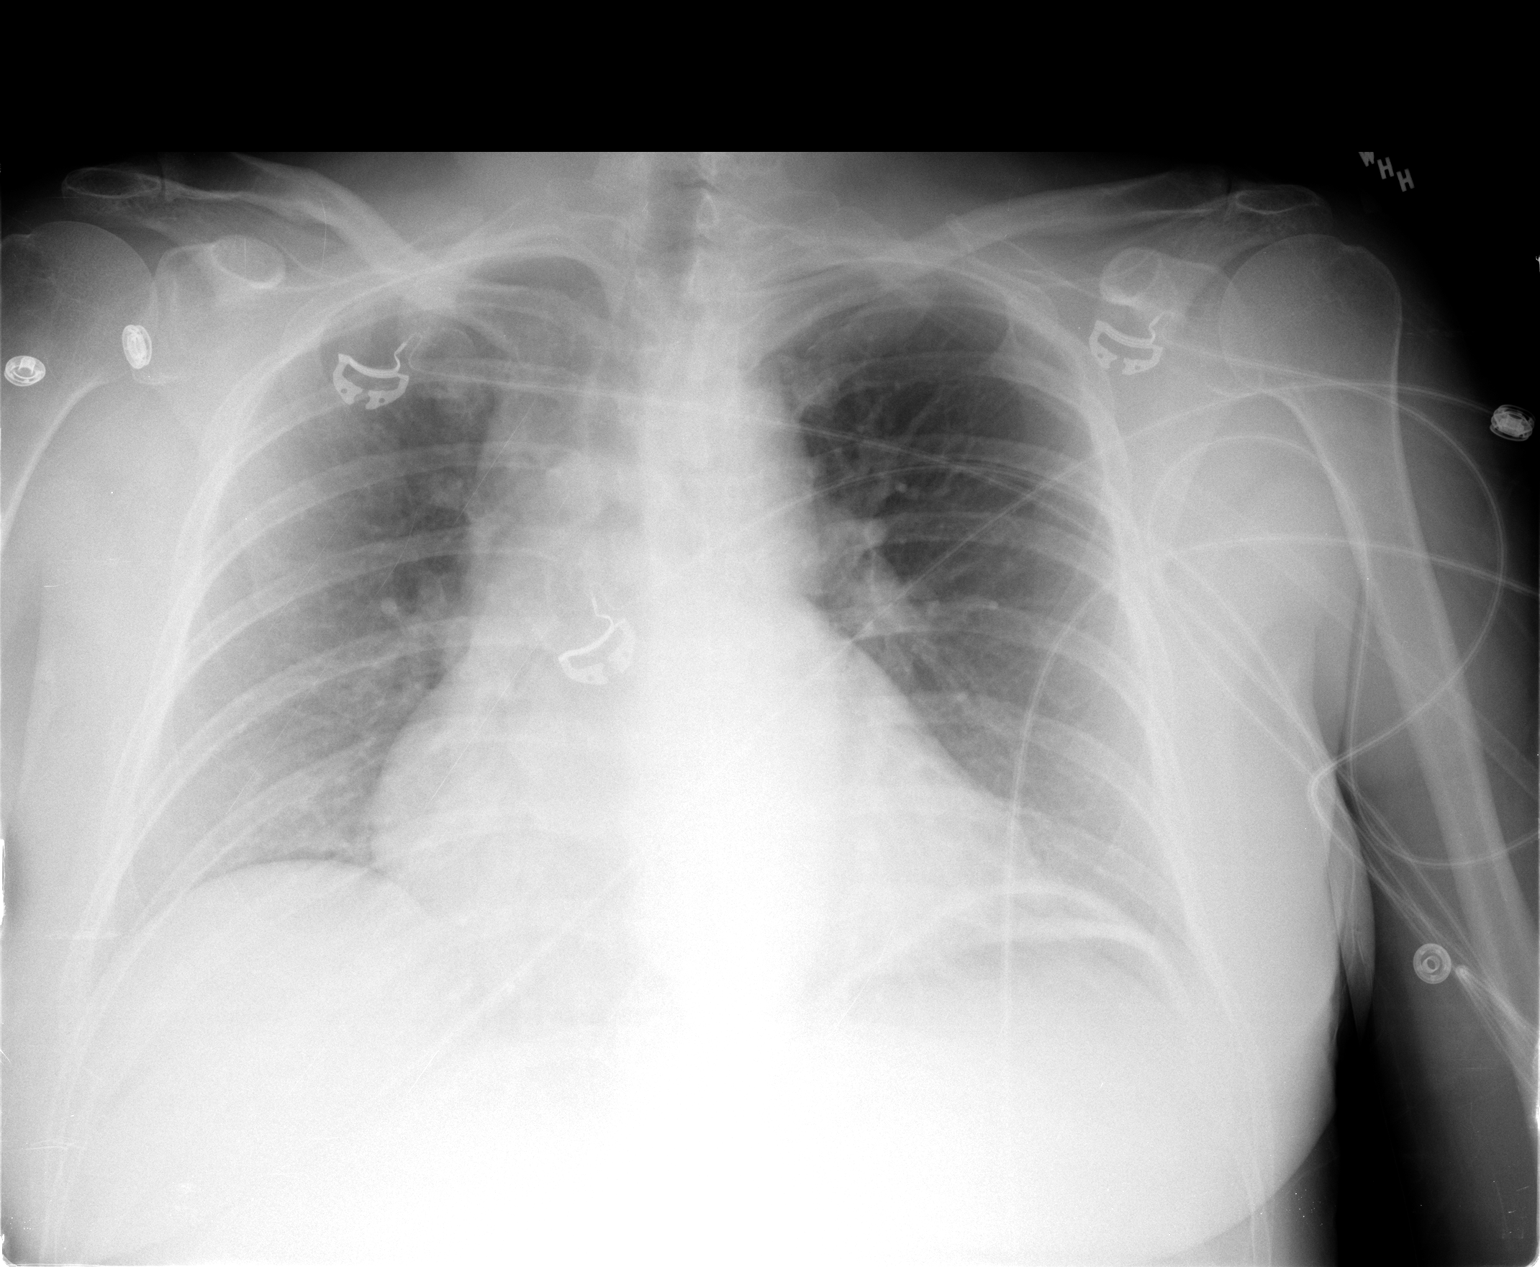

[1 of 1 positions shown; findings below may reference images not displayed]

FINDINGS: Heart size upper normal.  Negative for heart failure
pneumonia. Mild bibasilar atelectasis.
IMPRESSION: No active cardiopulmonary disease.

## 2017-01-15 ENCOUNTER — Encounter (HOSPITAL_COMMUNITY): Payer: Self-pay | Admitting: Emergency Medicine

## 2017-01-15 ENCOUNTER — Emergency Department (HOSPITAL_COMMUNITY)
Admission: EM | Admit: 2017-01-15 | Discharge: 2017-01-15 | Disposition: A | Discharge status: Home / Self Care | Attending: Emergency Medicine | Admitting: Emergency Medicine

## 2017-01-15 DIAGNOSIS — I5032 Chronic diastolic (congestive) heart failure: Secondary | ICD-10-CM | POA: Diagnosis not present

## 2017-01-15 DIAGNOSIS — Z7901 Long term (current) use of anticoagulants: Secondary | ICD-10-CM | POA: Insufficient documentation

## 2017-01-15 DIAGNOSIS — Z79899 Other long term (current) drug therapy: Secondary | ICD-10-CM | POA: Diagnosis not present

## 2017-01-15 DIAGNOSIS — I110 Hypertensive heart disease with heart failure: Secondary | ICD-10-CM | POA: Insufficient documentation

## 2017-01-15 DIAGNOSIS — M545 Low back pain: Secondary | ICD-10-CM | POA: Diagnosis present

## 2017-01-15 DIAGNOSIS — M5441 Lumbago with sciatica, right side: Secondary | ICD-10-CM | POA: Diagnosis not present

## 2017-01-15 MED ORDER — HYDROCODONE-ACETAMINOPHEN 5-325 MG PO TABS
1.0000 | ORAL_TABLET | Freq: Once | ORAL | Status: AC
  Administered 2017-01-15: 1 via ORAL
  Filled 2017-01-15: qty 1

## 2017-01-15 MED ORDER — METHOCARBAMOL 500 MG PO TABS
1000.0000 mg | ORAL_TABLET | Freq: Once | ORAL | Status: AC
  Administered 2017-01-15: 1000 mg via ORAL
  Filled 2017-01-15: qty 2

## 2017-01-15 MED ORDER — METHOCARBAMOL 500 MG PO TABS: 500.0000 mg | ORAL_TABLET | Freq: Three times a day (TID) | ORAL | 0 refills | Status: DC | PRN

## 2017-01-15 MED ORDER — HYDROCODONE-ACETAMINOPHEN 5-325 MG PO TABS: 2.0000 | ORAL_TABLET | ORAL | 0 refills | Status: DC | PRN

## 2017-01-15 NOTE — ED Triage Notes (Signed)
Pt c/o lower back pain. States she was dx with a bulging disc May 2017. Denies gi/gu/numbness/tingling.

## 2017-01-15 NOTE — ED Provider Notes (Signed)
[facility]-EMERGENCY DEPT Provider Note   CSN: [id number] Arrival date & time: 01/15/17  0915     History   Chief Complaint Chief Complaint  Patient presents with  . Back Pain    HPI Patricia Sellers is a 61 y.o. female.  HPI Patient presents with low back pain starting yesterday morning. No known trauma. States the pain radiates to the right buttocks. Similar to pain that she had last year when she was diagnosed with a bulging disc. She denies any focal weakness or numbness. No urinary hesitancy or incontinence. Denies fever or chills. Past Medical History:  Diagnosis Date  . Acute renal failure (HCC)   . Atrial fibrillation (HCC) 05/2010   Dr. Chales Abrahams at [facility]; Echo-mild to moderate MR, mild LVH,  moderate left atrial enlargement, atrial septal aneurysm, negative contrast study, EF of 35%.normal chemistry profile, TSH, chest x-ray, cardiac markers, electrolytes and renal tests.  Normal cardiac catheterization except for depressed LV function .  A sleeve a  . Bradycardia   . Chest pain   . Depression   . Diastolic CHF, acute (HCC)   . Hypertension   . Hypotension     Patient Active Problem List   Diagnosis Date Noted  . Bradycardia 09/22/2010  . Paroxysmal A-fib (HCC) 09/22/2010  . Hypertension 09/22/2010  . Hypercholesterolemia 09/22/2010    Past Surgical History:  Procedure Laterality Date  . ABDOMINAL HYSTERECTOMY    . BREAST LUMPECTOMY     benign    OB History    No data available       Home Medications    Prior to Admission medications   Medication Sig Start Date End Date Taking? Authorizing Provider  buPROPion (WELLBUTRIN XL) 300 MG 24 hr tablet Take 300 mg by mouth daily.   Yes [provider]  cetirizine (ZYRTEC) 10 MG tablet Take 10 mg by mouth daily.   Yes [provider]  hydroxypropyl methylcellulose (ISOPTO TEARS) 2.5 % ophthalmic solution Place into both eyes 3 (three) times daily.     Yes [provider]    metoprolol tartrate (LOPRESSOR) 25 MG tablet Take 25 mg by mouth daily.   Yes [provider]  pravastatin (PRAVACHOL) 40 MG tablet Take 40 mg by mouth daily.   Yes [provider]  Rivaroxaban (XARELTO) 20 MG TABS Take 20 mg by mouth daily.   Yes [provider]  sotalol (BETAPACE) 120 MG tablet Take 120 mg by mouth 2 (two) times daily.   Yes [provider]  HYDROcodone-acetaminophen (NORCO) 5-325 MG tablet Take 2 tablets by mouth every 4 (four) hours as needed for severe pain. 01/15/17   Loren Racer, MD  methocarbamol (ROBAXIN) 500 MG tablet Take 1 tablet (500 mg total) by mouth every 8 (eight) hours as needed for muscle spasms. 01/15/17   Loren Racer, MD    Family History No family history on file.  Social History Social History  Substance Use Topics  . Smoking status: Never Smoker  . Smokeless tobacco: Never Used  . Alcohol use Yes     Allergies   Percocet [oxycodone-acetaminophen] and Benzoyl peroxide   Review of Systems Review of Systems  Constitutional: Negative for chills and fever.  Respiratory: Negative for shortness of breath.   Cardiovascular: Negative for chest pain.  Gastrointestinal: Negative for abdominal pain, diarrhea, nausea and vomiting.  Genitourinary: Negative for difficulty urinating, dysuria, flank pain and frequency.  Musculoskeletal: Positive for back pain and myalgias. Negative for gait problem, neck  pain and neck stiffness.  Skin: Negative for rash and wound.  Neurological: Negative for dizziness, weakness, light-headedness, numbness and headaches.  All other systems reviewed and are negative.    Physical Exam Updated Vital Signs BP (!) 163/110   Pulse 67   Temp 97.6 F (36.4 C)   Resp 18   Ht  (1.727 m)   Wt 76.7 kg (169 lb)   SpO2 98%   BMI 25.70 kg/m   Physical Exam  Constitutional: She is oriented to person, place, and time. She appears well-developed and well-nourished. No  distress.  HENT:  Head: Normocephalic and atraumatic.  Eyes: Pupils are equal, round, and reactive to light. EOM are normal.  Neck: Normal range of motion. Neck supple.  Cardiovascular: Normal rate.   Pulmonary/Chest: Effort normal.  Abdominal: Soft. Bowel sounds are normal. There is no tenderness. There is no rebound and no guarding.  Musculoskeletal: Normal range of motion. She exhibits tenderness. She exhibits no edema.  No CVA tenderness bilaterally. No midline thoracic or lumbar tenderness. Patient does have right sided lumbar paraspinal tenderness to palpation. Negative straight leg raise bilaterally. No lower extremity swelling or asymmetry. Distal pulses are 2+.  Neurological: She is alert and oriented to person, place, and time.  Patient is alert and oriented x3 with clear, goal oriented speech. Patient has 5/5 motor in all extremities. Sensation is intact to light touch. No saddle anesthesia. Patient has a normal gait and walks without assistance.  Skin: Skin is warm and dry. Capillary refill takes less than 2 seconds. No rash noted. No erythema.  Psychiatric: She has a normal mood and affect. Her behavior is normal.  Nursing note and vitals reviewed.    ED Treatments / Results  Labs (all labs ordered are listed, but only abnormal results are displayed) Labs Reviewed - No data to display  EKG  EKG Interpretation None       Radiology No results found.  Procedures Procedures (including critical care time)  Medications Ordered in ED Medications  methocarbamol (ROBAXIN) tablet 1,000 mg (1,000 mg Oral Given 01/15/17 1004)  HYDROcodone-acetaminophen (NORCO/VICODIN) 5-325 MG per tablet 1 tablet (1 tablet Oral Given 01/15/17 1004)     Initial Impression / Assessment and Plan / ED Course  I have reviewed the triage vital signs and the nursing notes.  Pertinent labs & imaging results that were available during my care of the patient were reviewed by me and considered in my  medical decision making (see chart for details).     No red flag signs or symptoms. Will treat symptomatically. Patient states she's believe she's had hydrocodone in the past with no allergic reaction. Will give dose and observe   Patient states pain is improved. Continue to have normal neurologic exam. No allergic reaction with hydrocodone.Will follow with her primary physician. Return precautions given. Final Clinical Impressions(s) / ED Diagnoses   Final diagnoses:  Acute right-sided low back pain with right-sided sciatica    New Prescriptions New Prescriptions   HYDROCODONE-ACETAMINOPHEN (NORCO) 5-325 MG TABLET    Take 2 tablets by mouth every 4 (four) hours as needed for severe pain.   METHOCARBAMOL (ROBAXIN) 500 MG TABLET    Take 1 tablet (500 mg total) by mouth every 8 (eight) hours as needed for muscle spasms.     Loren Racer, MD 01/15/17 [phone]

## 2017-06-16 ENCOUNTER — Emergency Department (HOSPITAL_COMMUNITY): Admission: EM | Admit: 2017-06-16 | Discharge: 2017-06-16 | Discharge status: Against Medical Advice

## 2017-06-16 ENCOUNTER — Encounter (HOSPITAL_COMMUNITY): Payer: Self-pay

## 2017-06-16 ENCOUNTER — Other Ambulatory Visit: Payer: Self-pay

## 2017-06-16 NOTE — ED Triage Notes (Signed)
PAtient reports of lower back pain/neck pain x1 week. Denies injury.

## 2017-06-16 NOTE — ED Notes (Signed)
Patient left per registration

## 2017-06-16 NOTE — ED Notes (Signed)
No answer in lobby when called for triage 

## 2017-06-16 NOTE — ED Notes (Signed)
Charting from 1439 to 1440 is on incorrect patient.

## 2018-04-27 ENCOUNTER — Encounter (HOSPITAL_COMMUNITY): Payer: Self-pay

## 2018-04-27 ENCOUNTER — Emergency Department (HOSPITAL_COMMUNITY)
Admission: EM | Admit: 2018-04-27 | Discharge: 2018-04-27 | Disposition: A | Discharge status: Home / Self Care | Attending: Emergency Medicine | Admitting: Emergency Medicine

## 2018-04-27 ENCOUNTER — Other Ambulatory Visit: Payer: Self-pay

## 2018-04-27 ENCOUNTER — Emergency Department (HOSPITAL_COMMUNITY)

## 2018-04-27 DIAGNOSIS — J111 Influenza due to unidentified influenza virus with other respiratory manifestations: Secondary | ICD-10-CM

## 2018-04-27 DIAGNOSIS — I5030 Unspecified diastolic (congestive) heart failure: Secondary | ICD-10-CM | POA: Diagnosis not present

## 2018-04-27 DIAGNOSIS — J101 Influenza due to other identified influenza virus with other respiratory manifestations: Secondary | ICD-10-CM | POA: Insufficient documentation

## 2018-04-27 DIAGNOSIS — Z950 Presence of cardiac pacemaker: Secondary | ICD-10-CM | POA: Diagnosis not present

## 2018-04-27 DIAGNOSIS — Z79899 Other long term (current) drug therapy: Secondary | ICD-10-CM | POA: Insufficient documentation

## 2018-04-27 DIAGNOSIS — I110 Hypertensive heart disease with heart failure: Secondary | ICD-10-CM | POA: Insufficient documentation

## 2018-04-27 DIAGNOSIS — R05 Cough: Secondary | ICD-10-CM | POA: Diagnosis present

## 2018-04-27 LAB — INFLUENZA PANEL BY PCR (TYPE A & B)
Influenza A By PCR: POSITIVE — AB
Influenza B By PCR: NEGATIVE

## 2018-04-27 LAB — GROUP A STREP BY PCR: Group A Strep by PCR: NOT DETECTED

## 2018-04-27 MED ORDER — PROMETHAZINE HCL 12.5 MG PO TABS
12.5000 mg | ORAL_TABLET | Freq: Once | ORAL | Status: AC
  Administered 2018-04-27: 12.5 mg via ORAL
  Filled 2018-04-27: qty 1

## 2018-04-27 MED ORDER — HYDROCODONE-ACETAMINOPHEN 5-325 MG PO TABS
2.0000 | ORAL_TABLET | Freq: Once | ORAL | Status: AC
  Administered 2018-04-27: 2 via ORAL
  Filled 2018-04-27: qty 2

## 2018-04-27 MED ORDER — IBUPROFEN 400 MG PO TABS
400.0000 mg | ORAL_TABLET | Freq: Once | ORAL | Status: AC
  Administered 2018-04-27: 400 mg via ORAL
  Filled 2018-04-27: qty 1

## 2018-04-27 MED ORDER — OSELTAMIVIR PHOSPHATE 75 MG PO CAPS
75.0000 mg | ORAL_CAPSULE | Freq: Once | ORAL | Status: AC
  Administered 2018-04-27: 75 mg via ORAL
  Filled 2018-04-27: qty 1

## 2018-04-27 MED ORDER — ALBUTEROL SULFATE HFA 108 (90 BASE) MCG/ACT IN AERS
2.0000 | INHALATION_SPRAY | Freq: Once | RESPIRATORY_TRACT | Status: AC
  Administered 2018-04-27: 2 via RESPIRATORY_TRACT
  Filled 2018-04-27: qty 6.7

## 2018-04-27 MED ORDER — IPRATROPIUM-ALBUTEROL 0.5-2.5 (3) MG/3ML IN SOLN
3.0000 mL | Freq: Once | RESPIRATORY_TRACT | Status: AC
  Administered 2018-04-27: 3 mL via RESPIRATORY_TRACT
  Filled 2018-04-27: qty 3

## 2018-04-27 MED ORDER — PROMETHAZINE HCL 12.5 MG PO TABS: 12.5000 mg | ORAL_TABLET | Freq: Four times a day (QID) | ORAL | 0 refills | Status: DC | PRN

## 2018-04-27 MED ORDER — HYDROCODONE-ACETAMINOPHEN 5-325 MG PO TABS: 1.0000 | ORAL_TABLET | ORAL | 0 refills | Status: DC | PRN

## 2018-04-27 MED ORDER — OSELTAMIVIR PHOSPHATE 75 MG PO CAPS: 75.0000 mg | ORAL_CAPSULE | Freq: Two times a day (BID) | ORAL | 0 refills | Status: DC

## 2018-04-27 NOTE — ED Provider Notes (Signed)
[facility] EMERGENCY DEPARTMENT Provider Note   CSN: [id number] Arrival date & time: 04/27/18  [id number]     History   Chief Complaint Chief Complaint  Patient presents with  . Cough    HPI Patricia Sellers is a 62 y.o. female.  Patient is a 62 year old female who presents to the emergency department with a complaint of cough and fever.  The patient states that she has been sick over the last 4 or 5 days.  She recently returned from a cruise.  She has been having congestion, cough, and at times some difficulty with with breathing.  She has been trying Mucinex.  She has been using Tylenol for the fever.  These have been only partially successful.  The temperature maximum has been 101.  No unusual rash.  No unusual weakness.  No excessive vomiting or diarrhea reported.  Pt states the symptoms feel similar to an episode of pneumonia in the past. Patient presents now for assistance with this issue.  The history is provided by the patient.  Cough  Associated symptoms include chills, myalgias and shortness of breath. Pertinent negatives include no chest pain and no wheezing.    Past Medical History:  Diagnosis Date  . Acute renal failure (HCC)   . Atrial fibrillation (HCC) 05/2010   Dr. Chales Abrahamsyson at [facility]; Echo-mild to moderate MR, mild LVH,  moderate left atrial enlargement, atrial septal aneurysm, negative contrast study, EF of 35%.normal chemistry profile, TSH, chest x-ray, cardiac markers, electrolytes and renal tests.  Normal cardiac catheterization except for depressed LV function .  A sleeve a  . Bradycardia   . Chest pain   . Depression   . Diastolic CHF, acute (HCC)   . Hypertension   . Hypotension     Patient Active Problem List   Diagnosis Date Noted  . Bradycardia 09/22/2010  . Paroxysmal A-fib (HCC) 09/22/2010  . Hypertension 09/22/2010  . Hypercholesterolemia 09/22/2010    Past Surgical History:  Procedure Laterality Date  . ABDOMINAL HYSTERECTOMY      . BREAST LUMPECTOMY     benign  . PACEMAKER IMPLANT       OB History   No obstetric history on file.      Home Medications    Prior to Admission medications   Medication Sig Start Date End Date Taking? Authorizing Provider  buPROPion (WELLBUTRIN XL) 300 MG 24 hr tablet Take 300 mg by mouth daily.    [provider]  cetirizine (ZYRTEC) 10 MG tablet Take 10 mg by mouth daily.    [provider]  HYDROcodone-acetaminophen (NORCO) 5-325 MG tablet Take 2 tablets by mouth every 4 (four) hours as needed for severe pain. 01/15/17   Loren RacerYelverton, David, MD  hydroxypropyl methylcellulose (ISOPTO TEARS) 2.5 % ophthalmic solution Place into both eyes 3 (three) times daily.      [provider]  methocarbamol (ROBAXIN) 500 MG tablet Take 1 tablet (500 mg total) by mouth every 8 (eight) hours as needed for muscle spasms. 01/15/17   Loren RacerYelverton, David, MD  metoprolol tartrate (LOPRESSOR) 25 MG tablet Take 25 mg by mouth daily.    [provider]  pravastatin (PRAVACHOL) 40 MG tablet Take 40 mg by mouth daily.    [provider]  Rivaroxaban (XARELTO) 20 MG TABS Take 20 mg by mouth daily.    [provider]  sotalol (BETAPACE) 120 MG tablet Take 120 mg by mouth 2 (two) times daily.    [provider]  Family History No family history on file.  Social History Social History   Tobacco Use  . Smoking status: Never Smoker  . Smokeless tobacco: Never Used  Substance Use Topics  . Alcohol use: Yes    Comment: occ  . Drug use: No     Allergies   Percocet [oxycodone-acetaminophen] and Benzoyl peroxide   Review of Systems Review of Systems  Constitutional: Positive for appetite change, chills, fatigue and fever. Negative for activity change.       All ROS Neg except as noted in HPI  HENT: Positive for congestion. Negative for nosebleeds.   Eyes: Negative for photophobia and discharge.  Respiratory: Positive for cough and  shortness of breath. Negative for wheezing.   Cardiovascular: Negative for chest pain and palpitations.  Gastrointestinal: Negative for abdominal pain and blood in stool.  Genitourinary: Negative for dysuria, frequency and hematuria.  Musculoskeletal: Positive for myalgias. Negative for arthralgias, back pain and neck pain.  Skin: Negative.   Neurological: Negative for dizziness, seizures and speech difficulty.  Psychiatric/Behavioral: Negative for confusion and hallucinations.     Physical Exam Updated Vital Signs BP 119/87 (BP Location: Right Arm)   Pulse 94   Temp 98.1 F (36.7 C) (Oral)   Resp (!) 22   Ht 5\' 8"  (1.727 m)   Wt 77.1 kg   SpO2 95%   BMI 25.85 kg/m   Physical Exam Vitals signs and nursing note reviewed.  Constitutional:      Appearance: She is well-developed. She is not toxic-appearing.  HENT:     Head: Normocephalic.     Right Ear: Tympanic membrane and external ear normal.     Left Ear: Tympanic membrane and external ear normal.     Nose: Congestion present.     Mouth/Throat:     Pharynx: Posterior oropharyngeal erythema present.  Eyes:     General: Lids are normal.     Pupils: Pupils are equal, round, and reactive to light.  Neck:     Musculoskeletal: Normal range of motion and neck supple. No neck rigidity or muscular tenderness.     Vascular: No carotid bruit.  Cardiovascular:     Rate and Rhythm: Normal rate and regular rhythm.     Pulses: Normal pulses.     Heart sounds: Normal heart sounds.  Pulmonary:     Effort: No respiratory distress.     Breath sounds: Wheezing and rhonchi present.     Comments: There is symmetrical rise and fall of the chest.  There is mild tachypnea present.  Patient speaks in complete sentences without problem. Abdominal:     General: Bowel sounds are normal.     Palpations: Abdomen is soft.     Tenderness: There is no abdominal tenderness. There is no guarding.  Musculoskeletal: Normal range of motion.   Lymphadenopathy:     Head:     Right side of head: No submandibular adenopathy.     Left side of head: No submandibular adenopathy.     Cervical: No cervical adenopathy.  Skin:    General: Skin is warm and dry.  Neurological:     Mental Status: She is alert and oriented to person, place, and time.     Cranial Nerves: No cranial nerve deficit.     Sensory: No sensory deficit.  Psychiatric:        Speech: Speech normal.      ED Treatments / Results  Labs (all labs ordered are listed, but only abnormal results are  displayed) Labs Reviewed - No data to display  EKG None  Radiology No results found.  Procedures Procedures (including critical care time)  Medications Ordered in ED Medications - No data to display   Initial Impression / Assessment and Plan / ED Course  I have reviewed the triage vital signs and the nursing notes.  Pertinent labs & imaging results that were available during my care of the patient were reviewed by me and considered in my medical decision making (see chart for details).       Final Clinical Impressions(s) / ED Diagnoses MDM  Vital signs within normal limits with exception of mild tachypnea.  Pulse oximetry is 95% on room air.  Within normal limits by my interpretation.  Patient will be tested for flu, strep, and we will obtain a chest x-ray.  Patient treated with DuoNeb nebulizer at this time.  Recheck.  Patient states she is breathing much easier.  She has a headache and feels nauseated.  She thinks that the nausea is related to cough.  Patient informed of influenza diagnosis.  Patient will be treated with Norco and promethazine.  Chest x-ray pending. Chest xray neg for acute changes or problem.  Patient will be treated with Tamiflu, promethazine and short course of Norco.  The patient is to follow-up with the primary physician or return to the emergency department if any changes in condition, problems, or concerns.  Patient is in  agreement with this plan.   Final diagnoses:  Influenza    ED Discharge Orders         Ordered    oseltamivir (TAMIFLU) 75 MG capsule  Every 12 hours,   Status:  Discontinued     04/27/18 1043    HYDROcodone-acetaminophen (NORCO/VICODIN) 5-325 MG tablet  Every 4 hours PRN     04/27/18 1046    promethazine (PHENERGAN) 12.5 MG tablet  Every 6 hours PRN     04/27/18 1046    oseltamivir (TAMIFLU) 75 MG capsule  Every 12 hours     04/27/18 1052           Patricia Quale, PA-C 04/28/18 1649    Samuel Jester, DO 04/28/18 1918

## 2018-04-27 NOTE — ED Triage Notes (Signed)
Pt reports cough and cold symptoms x 4 days. Reports fever initially but no fever for the past 2 days.    Has been taking mucinex.

## 2018-04-27 NOTE — Discharge Instructions (Signed)
Your chest x-ray is negative for acute problem.  Your strep screen is negative.  Your influenza screen is positive.  Please use a mask until symptoms have resolved.  Please wash hands frequently.  Please have all members of your home wash hands frequently.  Use Tamiflu 2 times daily with a meal.  Please increase fluids.  Use Tylenol every 4 hours, or ibuprofen every 6 hours for fever or aching.  Use albuterol 2 puffs every 4 hours for assistance with breathing or if problems with wheezing.  See your primary physician or return to the emergency department if any changes in your condition, problems, or concerns.

## 2018-10-27 ENCOUNTER — Ambulatory Visit (INDEPENDENT_AMBULATORY_CARE_PROVIDER_SITE_OTHER): Admitting: Emergency Medicine

## 2018-10-27 ENCOUNTER — Ambulatory Visit (INDEPENDENT_AMBULATORY_CARE_PROVIDER_SITE_OTHER)

## 2018-10-27 ENCOUNTER — Other Ambulatory Visit: Payer: Self-pay

## 2018-10-27 ENCOUNTER — Encounter: Payer: Self-pay | Admitting: Emergency Medicine

## 2018-10-27 VITALS — BP 122/78 | HR 91 | Ht 68.0 in | Wt 171.0 lb

## 2018-10-27 DIAGNOSIS — R0609 Other forms of dyspnea: Secondary | ICD-10-CM | POA: Diagnosis not present

## 2018-10-27 DIAGNOSIS — R05 Cough: Secondary | ICD-10-CM

## 2018-10-27 DIAGNOSIS — R059 Cough, unspecified: Secondary | ICD-10-CM

## 2018-10-27 DIAGNOSIS — R0600 Dyspnea, unspecified: Secondary | ICD-10-CM

## 2018-10-27 DIAGNOSIS — R053 Chronic cough: Secondary | ICD-10-CM | POA: Insufficient documentation

## 2018-10-27 MED ORDER — FLUTICASONE PROPIONATE 50 MCG/ACT NA SUSP: 2.0000 | Freq: Every day | NASAL | 5 refills | Status: AC

## 2018-10-27 MED ORDER — PANTOPRAZOLE SODIUM 40 MG PO TBEC: 40.0000 mg | DELAYED_RELEASE_TABLET | Freq: Every day | ORAL | 5 refills | Status: AC

## 2018-10-27 NOTE — Progress Notes (Signed)
Subjective:    Patient ID: Patricia Sellers, female    DOB: 07/02/1955, 63 y.o.   MRN: [id number]  HPI 63 year old woman, small smoking history (5 pack years) with a history of hypertension on losartan, atrial fibrillation ablated in 10/2018 with pacer, associated diastolic CHF.  She is on Zyrtec for allergic rhinitis.  She is referred today for evaluation of cough and dyspnea.   She has been coughing for about 2 yrs, minimal sputum production looks clear. Worst in the am, then productive of clear mucous. She is on zyrtec, just want back to nasal rinses about a month ago - has helped her some. Worse when she is supine - can wake her with coughing. She has had GERD sx. Has gained 20-30 lbs over 2 years. She was tried on albuterol, believes that it helps her.   She [facility] that she states showed restrictive disease.    Review of Systems  Constitutional: Negative for fever and unexpected weight change.  HENT: Negative for congestion, dental problem, ear pain, nosebleeds, postnasal drip, rhinorrhea, sinus pressure, sneezing, sore throat and trouble swallowing.   Eyes: Negative for redness and itching.  Respiratory: Positive for cough and shortness of breath. Negative for chest tightness and wheezing.   Cardiovascular: Negative for palpitations and leg swelling.  Gastrointestinal: Positive for vomiting. Negative for nausea.  Genitourinary: Negative for dysuria.  Musculoskeletal: Negative for joint swelling.  Skin: Negative for rash.  Neurological: Negative for headaches.  Hematological: Does not bruise/bleed easily.  Psychiatric/Behavioral: Negative for dysphoric mood. The patient is not nervous/anxious.      Past Medical History:  Diagnosis Date  . Acute renal failure (HCC)   . Atrial fibrillation (HCC) 05/2010   Dr. Chales Abrahamsyson at [facility]; Echo-mild to moderate MR, mild LVH,  moderate left atrial enlargement, atrial septal aneurysm, negative  contrast study, EF of 35%.normal chemistry profile, TSH, chest x-ray, cardiac markers, electrolytes and renal tests.  Normal cardiac catheterization except for depressed LV function .  A sleeve a  . Bradycardia   . Chest pain   . Depression   . Diastolic CHF, acute (HCC)   . Hypertension   . Hypotension      No family history on file.   Social History   Socioeconomic History  . Marital status: Divorced    Spouse name: Not on file  . Number of children: Not on file  . Years of education: Not on file  . Highest education level: Not on file  Occupational History  . Not on file  Social Needs  . Financial resource strain: Not on file  . Food insecurity    Worry: Not on file    Inability: Not on file  . Transportation needs    Medical: Not on file    Non-medical: Not on file  Tobacco Use  . Smoking status: Former Smoker    Packs/day: 1.00    Years: 10.00    Pack years: 10.00    Types: Cigarettes    Quit date: 05/04/1982    Years since quitting: 36.5  . Smokeless tobacco: Never Used  Substance and Sexual Activity  . Alcohol use: Yes    Comment: occ  . Drug use: No  . Sexual activity: Not on file  Lifestyle  . Physical activity    Days per week: Not on file    Minutes per session: Not on file  . Stress: Not on file  Relationships  .  Social Herbalist on phone: Not on file    Gets together: Not on file    Attends religious service: Not on file    Active member of club or organization: Not on file    Attends meetings of clubs or organizations: Not on file    Relationship status: Not on file  . Intimate partner violence    Fear of current or ex partner: Not on file    Emotionally abused: Not on file    Physically abused: Not on file    Forced sexual activity: Not on file  Other Topics Concern  . Not on file  Social History Narrative  . Not on file  used to work daycare, retired.  Has lived in [place], [place]  Allergies  Allergen Reactions  . Percocet  [Oxycodone-Acetaminophen]   . Benzoyl Peroxide Rash     Outpatient Medications Prior to Visit  Medication Sig Dispense Refill  . albuterol (PROAIR HFA) 108 (90 Base) MCG/ACT inhaler Inhale 2 puffs into the lungs every 6 (six) hours as needed for wheezing or shortness of breath.    . cetirizine (ZYRTEC) 10 MG tablet Take 10 mg by mouth daily.    . diclofenac sodium (VOLTAREN) 1 % GEL Apply 2 g topically daily as needed.    Marland Kitchen losartan (COZAAR) 50 MG tablet Take 50 mg by mouth daily.    . pravastatin (PRAVACHOL) 40 MG tablet Take 40 mg by mouth daily.    . Rivaroxaban (XARELTO) 20 MG TABS Take 20 mg by mouth daily.    Marland Kitchen buPROPion (WELLBUTRIN XL) 300 MG 24 hr tablet Take 300 mg by mouth daily.    Marland Kitchen HYDROcodone-acetaminophen (NORCO/VICODIN) 5-325 MG tablet Take 1 tablet by mouth every 4 (four) hours as needed. 12 tablet 0  . hydroxypropyl methylcellulose (ISOPTO TEARS) 2.5 % ophthalmic solution Place into both eyes 3 (three) times daily.      . methocarbamol (ROBAXIN) 500 MG tablet Take 1 tablet (500 mg total) by mouth every 8 (eight) hours as needed for muscle spasms. 30 tablet 0  . metoprolol tartrate (LOPRESSOR) 25 MG tablet Take 25 mg by mouth daily.    Marland Kitchen oseltamivir (TAMIFLU) 75 MG capsule Take 1 capsule (75 mg total) by mouth every 12 (twelve) hours. 10 capsule 0  . promethazine (PHENERGAN) 12.5 MG tablet Take 1 tablet (12.5 mg total) by mouth every 6 (six) hours as needed. 12 tablet 0  . sotalol (BETAPACE) 120 MG tablet Take 120 mg by mouth 2 (two) times daily.     No facility-administered medications prior to visit.         Objective:   Physical Exam Vitals:   10/27/18 1108  BP: 122/78  Pulse: 91  SpO2: 95%  Weight: 171 lb (77.6 kg)  Height: 5\' 8"  (1.727 m)   Gen: Pleasant, well-nourished, in no distress,  normal affect  ENT: No lesions,  mouth clear,  oropharynx clear, no postnasal drip  Neck: No JVD, no stridor  Lungs: No use of accessory muscles, no crackles or  wheezing on normal respiration, no wheeze on forced expiration  Cardiovascular: RRR, heart sounds normal, no murmur or gallops, no peripheral edema  Musculoskeletal: No deformities, no cyanosis or clubbing  Neuro: alert, awake, non focal  Skin: Warm, no lesions or rash     Assessment & Plan:  Chronic cough This is been present for at least 2 years.  She does have some of the common potential triggers including chronic sinus  drainage and rhinitis.  She may also have GERD although if so this is not causing significant chest discomfort.  Consider obstructive lung disease, she has had spirometry in 2010 but I do not have these results.  We will obtain PFT.  Continue to treat allergic rhinitis, do a trial of GERD therapy as well see if this helps her cough.  We will perform pulmonary function testing at your next visit Please continue your Zyrtec once daily as you have been taking it Please continue to do your nasal saline rinses at least once daily Start fluticasone nasal spray, 2 sprays each nostril once daily until next visit Start pantoprazole 40 mg once daily until next visit.  Take this medication 1 hour around food.  You may want to take it at dinnertime so that it will be most effective overnight Follow with Dr Delton CoombesByrum in 1 month with full PFT  Dyspnea on exertion Progressive shortness of breath.  She states that this could be related to some weight gain.  She needs pulmonary function testing, chest x-ray.  She may have responded to albuterol trial.  She was given Flovent but has not started it yet.  I'd like to confirm whether there is true obstructive lung disease before we decide about an ICS.  Levy Pupaobert Higinio Grow, MD, PhD 10/27/2018, 11:47 AM [facility] [phone] or if no answer [phone]

## 2018-10-27 NOTE — Assessment & Plan Note (Signed)
Progressive shortness of breath.  She states that this could be related to some weight gain.  She needs pulmonary function testing, chest x-ray.  She may have responded to albuterol trial.  She was given Flovent but has not started it yet.  I'd like to confirm whether there is true obstructive lung disease before we decide about an ICS.

## 2018-10-27 NOTE — Patient Instructions (Signed)
We will perform pulmonary function testing at your next visit Please continue your Zyrtec once daily as you have been taking it Please continue to do your nasal saline rinses at least once daily Start fluticasone nasal spray, 2 sprays each nostril once daily until next visit Start pantoprazole 40 mg once daily until next visit.  Take this medication 1 hour around food.  You may want to take it at dinnertime so that it will be most effective overnight Chest x-ray today to evaluate for shortness of breath Follow with Dr Lamonte Sakai in 1 month with full PFT

## 2018-10-27 NOTE — Assessment & Plan Note (Signed)
This is been present for at least 2 years.  She does have some of the common potential triggers including chronic sinus drainage and rhinitis.  She may also have GERD although if so this is not causing significant chest discomfort.  Consider obstructive lung disease, she has had spirometry in 2010 but I do not have these results.  We will obtain PFT.  Continue to treat allergic rhinitis, do a trial of GERD therapy as well see if this helps her cough.  We will perform pulmonary function testing at your next visit Please continue your Zyrtec once daily as you have been taking it Please continue to do your nasal saline rinses at least once daily Start fluticasone nasal spray, 2 sprays each nostril once daily until next visit Start pantoprazole 40 mg once daily until next visit.  Take this medication 1 hour around food.  You may want to take it at dinnertime so that it will be most effective overnight Follow with Dr Lamonte Sakai in 1 month with full PFT

## 2018-12-21 ENCOUNTER — Encounter

## 2018-12-21 ENCOUNTER — Ambulatory Visit: Admitting: Emergency Medicine

## 2020-07-09 IMAGING — DX DG CHEST 2V
2 series · 2 of 2 positions shown · non-contrast
Comparison: None.

CLINICAL DATA: cough and cold symptoms x 4 days. Tested positive
for flucough, congestion, fever.

EXAM:
CHEST - 2 VIEW

[chest pa]
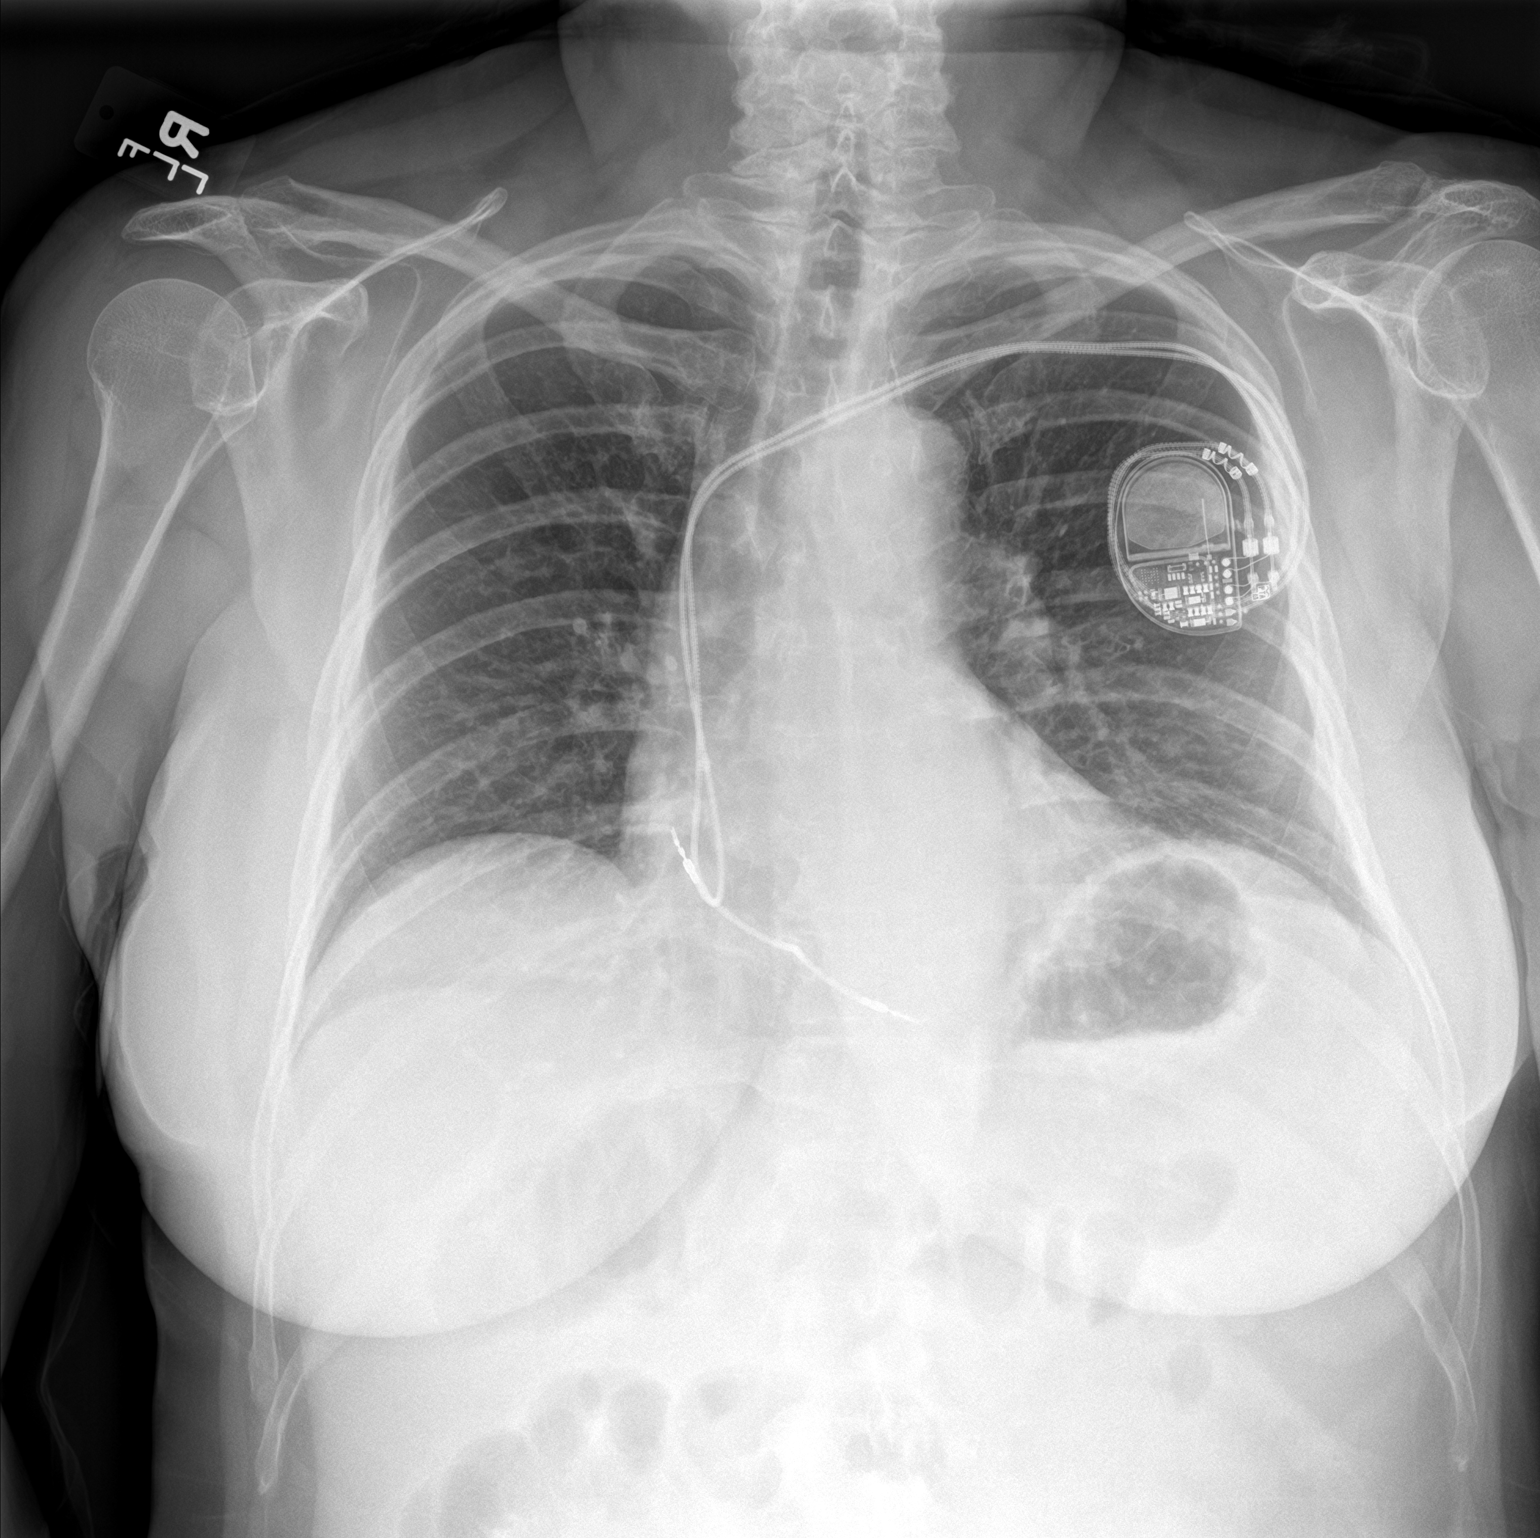

[chest lat]
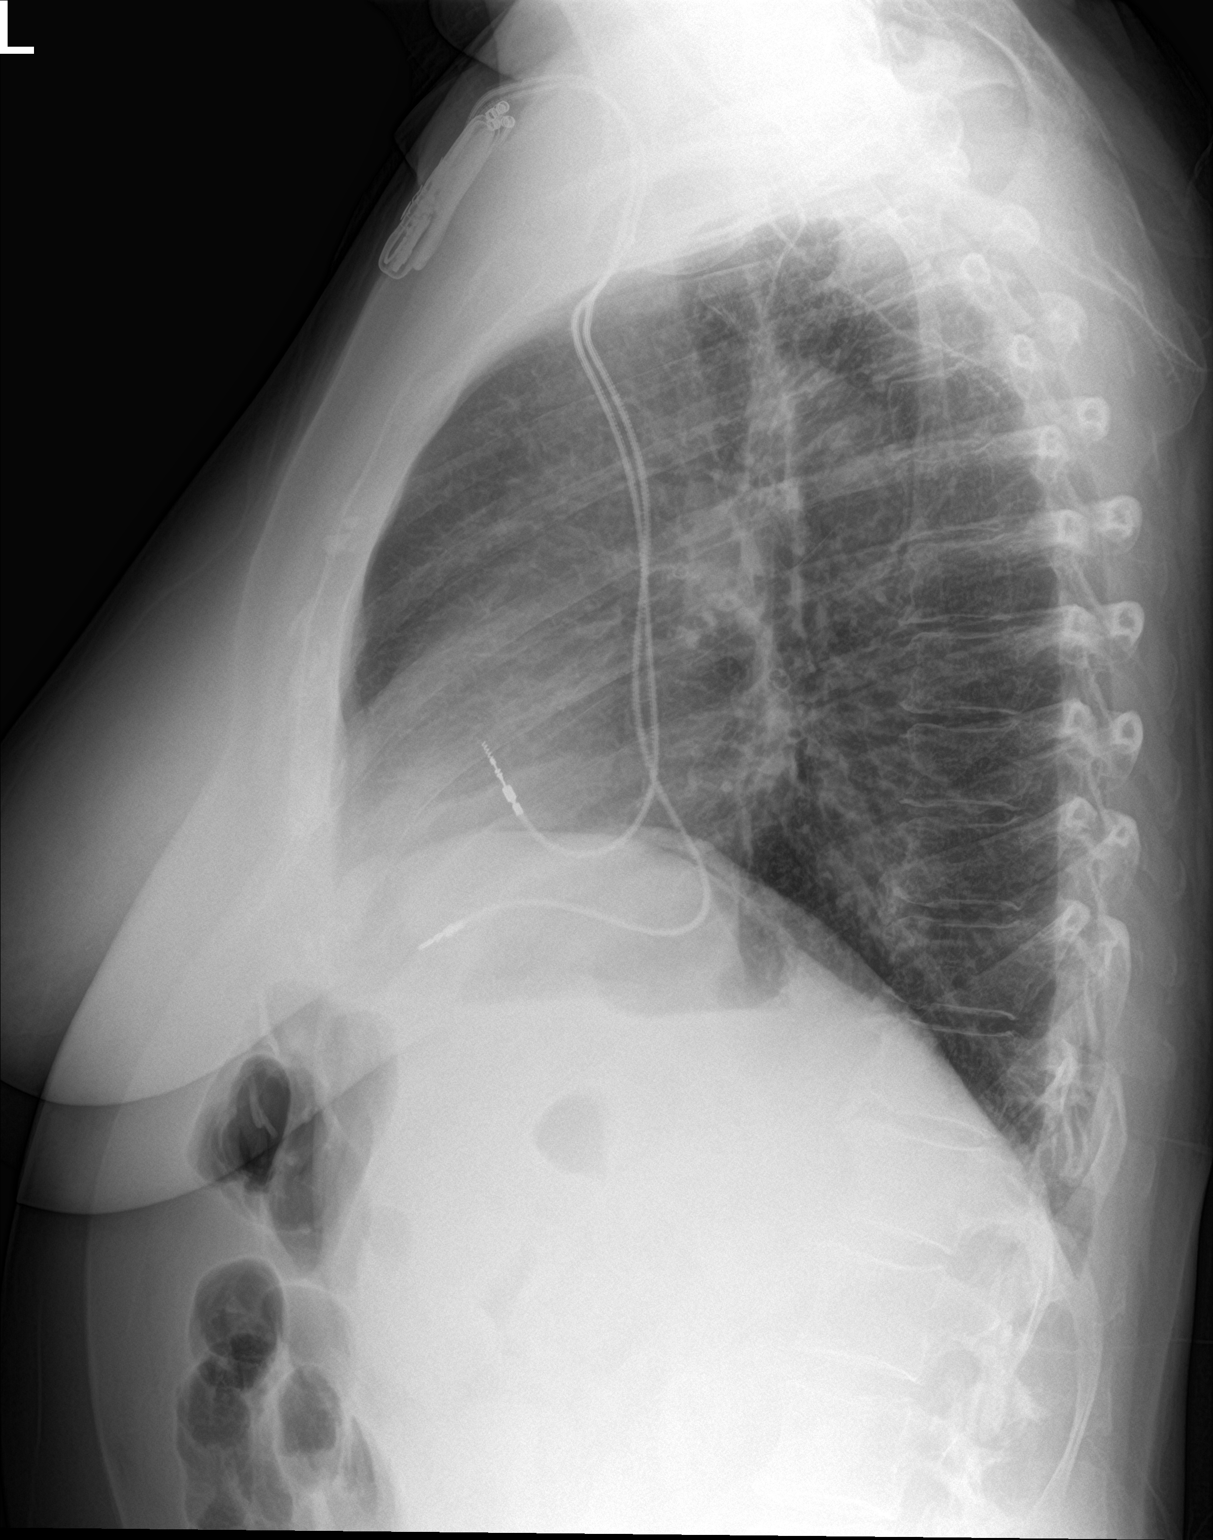

[2 of 2 positions shown; findings below may reference images not displayed]

FINDINGS: LEFT-sided pacemaker. Normal mediastinum and cardiac silhouette.
Normal pulmonary vasculature. No evidence of effusion, infiltrate,
or pneumothorax. No acute bony abnormality.
IMPRESSION: No acute cardiopulmonary process.

## 2022-01-18 ENCOUNTER — Ambulatory Visit
Admission: EM | Admit: 2022-01-18 | Discharge: 2022-01-18 | Disposition: A | Discharge status: Home / Self Care | Attending: Nurse Practitioner | Admitting: Nurse Practitioner

## 2022-01-18 ENCOUNTER — Encounter: Payer: Self-pay | Admitting: Emergency Medicine

## 2022-01-18 DIAGNOSIS — S61210A Laceration without foreign body of right index finger without damage to nail, initial encounter: Secondary | ICD-10-CM | POA: Diagnosis not present

## 2022-01-18 DIAGNOSIS — Z23 Encounter for immunization: Secondary | ICD-10-CM

## 2022-01-18 MED ORDER — KETOROLAC TROMETHAMINE 30 MG/ML IJ SOLN
30.0000 mg | Freq: Once | INTRAMUSCULAR | Status: AC
  Administered 2022-01-18: 30 mg via INTRAMUSCULAR

## 2022-01-18 MED ORDER — CEPHALEXIN 500 MG PO CAPS: 500.0000 mg | ORAL_CAPSULE | Freq: Four times a day (QID) | ORAL | 0 refills | Status: AC

## 2022-01-18 MED ORDER — TETANUS-DIPHTH-ACELL PERTUSSIS 5-2.5-18.5 LF-MCG/0.5 IM SUSY
0.5000 mL | PREFILLED_SYRINGE | Freq: Once | INTRAMUSCULAR | Status: AC
  Administered 2022-01-18: 0.5 mL via INTRAMUSCULAR

## 2022-01-18 NOTE — ED Triage Notes (Signed)
Right index finger laceration that happened yesterday around noon.   Cut with hedge trimers.  Has applied antibiotic ointment to cut.

## 2022-01-18 NOTE — Discharge Instructions (Signed)
We have put Dermabond on your finger laceration today.  Please keep this covered for the first 24 hours and wear the finger splint to help keep it in a stable place.  After 24 hours, you can take off the gauze and finger splint and shower like normal.  Please continue to wear the finger splint while there is Dermabond on your skin.  The Dermabond will fall off after about a week.  Please start on the antibiotic to help prevent infection.  We have updated your tetanus shot today and also given you a shot of Toradol to help with pain.  Continue Tylenol 500 to 1000 mg every 4-6 hours as needed for pain along with elevating your finger and keeping an ice pack over top of it to help with swelling.  Seek care if your symptoms worsen or persist despite treatment.

## 2022-01-18 NOTE — ED Provider Notes (Signed)
[facility]    CSN: [id number] Arrival date & time: 01/18/22  0930      History   Chief Complaint No chief complaint on file.   HPI Patricia Sellers is a 66 y.o. female.   Patient presents with laceration to right second digit that was sustained yesterday when cutting her hedges with a hedge cutter.  Reports immediately after this occurred, she cleaned it with peroxide, applied mupirocin, and cover the area.  Reports today, the finger is very painful and throbbing.  She has taken Tylenol for the pain without much relief.  She denies any numbness or tingling in her fingertip or decreased range of motion.  Last tetanus shot 2017.  History of pacemaker, used to take Xarelto but is no longer taking.    Past Medical History:  Diagnosis Date   Acute renal failure [facility])    Atrial fibrillation (HCC) 05/2010   Dr. Chales Abrahams at [facility]; Echo-mild to moderate MR, mild LVH,  moderate left atrial enlargement, atrial septal aneurysm, negative contrast study, EF of 35%.normal chemistry profile, TSH, chest x-ray, cardiac markers, electrolytes and renal tests.  Normal cardiac catheterization except for depressed LV function .  A sleeve a   Bradycardia    Chest pain    Depression    Diastolic CHF, acute (HCC)    Hypertension    Hypotension     Patient Active Problem List   Diagnosis Date Noted   Chronic cough 10/27/2018   Dyspnea on exertion 10/27/2018   Bradycardia 09/22/2010   Paroxysmal A-fib (HCC) 09/22/2010   Hypertension 09/22/2010   Hypercholesterolemia 09/22/2010    Past Surgical History:  Procedure Laterality Date   ABDOMINAL HYSTERECTOMY     BREAST LUMPECTOMY     benign   PACEMAKER IMPLANT      OB History   No obstetric history on file.      Home Medications    Prior to Admission medications   Medication Sig Start Date End Date Taking? Authorizing Provider  cephALEXin (KEFLEX) 500 MG capsule Take 1 capsule (500 mg total) by mouth 4 (four)  times daily for 5 days. 01/18/22 01/23/22 Yes Valentino Nose, NP  albuterol (PROAIR HFA) 108 (90 Base) MCG/ACT inhaler Inhale 2 puffs into the lungs every 6 (six) hours as needed for wheezing or shortness of breath.    [provider]  cetirizine (ZYRTEC) 10 MG tablet Take 10 mg by mouth daily.    [provider]  diclofenac sodium (VOLTAREN) 1 % GEL Apply 2 g topically daily as needed.    [provider]  fluticasone (FLONASE) 50 MCG/ACT nasal spray Place 2 sprays into both nostrils daily. 10/27/18   Leslye Peer, MD  losartan (COZAAR) 50 MG tablet Take 50 mg by mouth daily.    [provider]  pantoprazole (PROTONIX) 40 MG tablet Take 1 tablet (40 mg total) by mouth daily. 10/27/18   Leslye Peer, MD  pravastatin (PRAVACHOL) 40 MG tablet Take 40 mg by mouth daily.    [provider]  Rivaroxaban (XARELTO) 20 MG TABS Take 20 mg by mouth daily.    [provider]    Family History History reviewed. No pertinent family history.  Social History Social History   Tobacco Use   Smoking status: Former    Packs/day: 1.00    Years: 10.00    Total pack years: 10.00    Types: Cigarettes    Quit date: 05/04/1982    Years  since quitting: 39.7   Smokeless tobacco: Never  Substance Use Topics   Alcohol use: Yes    Comment: occ   Drug use: No     Allergies   Percocet [oxycodone-acetaminophen] and Benzoyl peroxide   Review of Systems Review of Systems Per HPI  Physical Exam Triage Vital Signs ED Triage Vitals  Enc Vitals Group     BP 01/18/22 0945 (!) 147/89     Pulse Rate 01/18/22 0945 91     Resp 01/18/22 0945 18     Temp 01/18/22 0945 98.7 F (37.1 C)     Temp Source 01/18/22 0945 Oral     SpO2 01/18/22 0945 98 %     Weight --      Height --      Head Circumference --      Peak Flow --      Pain Score 01/18/22 0946 8     Pain Loc --      Pain Edu? --      Excl. in [place]? --    No data found.  Updated Vital  Signs BP (!) 147/89 (BP Location: Right Arm)   Pulse 91   Temp 98.7 F (37.1 C) (Oral)   Resp 18   SpO2 98%   Visual Acuity Right Eye Distance:   Left Eye Distance:   Bilateral Distance:    Right Eye Near:   Left Eye Near:    Bilateral Near:     Physical Exam Vitals and nursing note reviewed.  Constitutional:      General: She is not in acute distress.    Appearance: Normal appearance. She is not toxic-appearing.  HENT:     Head: Normocephalic and atraumatic.     Mouth/Throat:     Mouth: Mucous membranes are moist.     Pharynx: Oropharynx is clear.  Pulmonary:     Effort: Pulmonary effort is normal. No respiratory distress.  Musculoskeletal:        General: Swelling and tenderness present.     Right hand: Laceration and tenderness present. Normal range of motion. Normal strength. Normal sensation. There is no disruption of two-point discrimination. Normal capillary refill. Normal pulse.     Left hand: Normal.       Hands:  Skin:    General: Skin is warm and dry.     Capillary Refill: Capillary refill takes less than 2 seconds.     Findings: Laceration present.     Comments: Approximately 3 cm laceration to left index distal fingertip laterally.  There is approximately 1.5 cm laceration along the width of the fingertip as marked.  At the fingertip, there is an avulsed area approximately 1 cm by half centimeter.  Neurological:     Mental Status: She is alert and oriented to person, place, and time.  Psychiatric:        Behavior: Behavior is cooperative.      UC Treatments / Results  Labs (all labs ordered are listed, but only abnormal results are displayed) Labs Reviewed - No data to display  EKG   Radiology No results found.  Procedures Laceration Repair  Date/Time: 01/18/2022 11:09 AM  Performed by: Eulogio Bear, NP Authorized by: Eulogio Bear, NP   Consent:    Consent obtained:  Verbal   Consent given by:  Patient   Risks, benefits,  and alternatives were discussed: yes     Risks discussed:  Infection, pain and poor wound healing   Alternatives discussed:  Observation Universal protocol:    Procedure explained and questions answered to patient or proxy's satisfaction: yes     Patient identity confirmed:  Verbally with patient Anesthesia:    Anesthesia method:  None Laceration details:    Location:  Finger   Finger location:  R index finger   Length (cm):  3 Treatment:    Area cleansed with:  Chlorhexidine   Amount of cleaning:  Standard Skin repair:    Repair method:  Tissue adhesive Approximation:    Approximation:  Close Repair type:    Repair type:  Simple Post-procedure details:    Dressing:  Non-adherent dressing and splint for protection   Procedure completion:  Tolerated well, no immediate complications  (including critical care time)  Medications Ordered in UC Medications  Tdap (BOOSTRIX) injection 0.5 mL (0.5 mLs Intramuscular Given 01/18/22 1037)  ketorolac (TORADOL) 30 MG/ML injection 30 mg (30 mg Intramuscular Given 01/18/22 1038)    Initial Impression / Assessment and Plan / UC Course  I have reviewed the triage vital signs and the nursing notes.  Pertinent labs & imaging results that were available during my care of the patient were reviewed by me and considered in my medical decision making (see chart for details).    Patient is slightly hypertensive, secondary to pain most likely.  She is afebrile, not tachycardic, not tachypneic, oxygenating well on room air.  Risk first benefit of laceration repair discussed with patient, she decides to proceed with laceration repair as above.  Wound care discussed.  Tdap updated.  Toradol 30 mg given for significant pain that she is having today.  Discussed use of only Tylenol at home to help with pain along with elevation and ice.  Start Keflex 500 mg 4 times daily for 5 days for empiric treatment of infection.  ER precautions and return precautions  discussed.  The patient was given the opportunity to ask questions.  All questions answered to their satisfaction.  The patient is in agreement to this plan.   Final Clinical Impressions(s) / UC Diagnoses   Final diagnoses:  Laceration of right index finger without foreign body without damage to nail, initial encounter     Discharge Instructions      We have put Dermabond on your finger laceration today.  Please keep this covered for the first 24 hours and wear the finger splint to help keep it in a stable place.  After 24 hours, you can take off the gauze and finger splint and shower like normal.  Please continue to wear the finger splint while there is Dermabond on your skin.  The Dermabond will fall off after about a week.  Please start on the antibiotic to help prevent infection.  We have updated your tetanus shot today and also given you a shot of Toradol to help with pain.  Continue Tylenol 500 to 1000 mg every 4-6 hours as needed for pain along with elevating your finger and keeping an ice pack over top of it to help with swelling.  Seek care if your symptoms worsen or persist despite treatment.    ED Prescriptions     Medication Sig Dispense Auth. Provider   cephALEXin (KEFLEX) 500 MG capsule Take 1 capsule (500 mg total) by mouth 4 (four) times daily for 5 days. 20 capsule Valentino Nose, NP      PDMP not reviewed this encounter.   Valentino Nose, NP 01/18/22 1110

## 2022-01-20 ENCOUNTER — Ambulatory Visit: Payer: Self-pay

## 2022-01-20 ENCOUNTER — Ambulatory Visit
Admission: EM | Admit: 2022-01-20 | Discharge: 2022-01-20 | Disposition: A | Discharge status: Home / Self Care | Attending: Family Medicine | Admitting: Family Medicine

## 2022-01-20 DIAGNOSIS — S61210D Laceration without foreign body of right index finger without damage to nail, subsequent encounter: Secondary | ICD-10-CM | POA: Diagnosis not present

## 2022-01-20 MED ORDER — CHLORHEXIDINE GLUCONATE 4 % EX LIQD: Freq: Every day | CUTANEOUS | 0 refills | Status: AC | PRN

## 2022-01-20 NOTE — ED Notes (Signed)
Non-adherant pad applied to site. Secured with coban. Pt tolerated well.  Site management and infection prevention education provided. Pt verbalized understanding.

## 2022-01-20 NOTE — ED Provider Notes (Signed)
[facility]    CSN: [id number] Arrival date & time: 01/20/22  0914      History   Chief Complaint Chief Complaint  Patient presents with   finger problem    HPI Patricia Sellers is a 66 y.o. female.   Patient presenting today following up on a laceration to her right index finger that occurred 2 days ago.  She was seen at this clinic after initial incident, wounds were cleaned and Dermabond was applied.  She was placed in a dressing and finger splint for protection and given a round of Keflex.  She has been diligently taking the Keflex.  She took her bandage off yesterday and notes there was some clear bloody drainage and feels like her finger is swollen and red.  She denies numbness, tingling, loss of range of motion.    Past Medical History:  Diagnosis Date   Acute renal failure [facility])    Atrial fibrillation ([place]) 05/2010   Dr. Elonda Husky at [facility]; Echo-mild to moderate MR, mild LVH,  moderate left atrial enlargement, atrial septal aneurysm, negative contrast study, EF of 35%.normal chemistry profile, TSH, chest x-ray, cardiac markers, electrolytes and renal tests.  Normal cardiac catheterization except for depressed LV function .  A sleeve a   Bradycardia    Chest pain    Depression    Diastolic CHF, acute ([place])    Hypertension    Hypotension     Patient Active Problem List   Diagnosis Date Noted   Chronic cough 10/27/2018   Dyspnea on exertion 10/27/2018   Bradycardia 09/22/2010   Paroxysmal A-fib ([place]) 09/22/2010   Hypertension 09/22/2010   Hypercholesterolemia 09/22/2010    Past Surgical History:  Procedure Laterality Date   ABDOMINAL HYSTERECTOMY     BREAST LUMPECTOMY     benign   PACEMAKER IMPLANT      OB History   No obstetric history on file.      Home Medications    Prior to Admission medications   Medication Sig Start Date End Date Taking? Authorizing Provider  chlorhexidine (HIBICLENS) 4 % external liquid Apply topically  daily as needed. 01/20/22  Yes Volney American, PA-C  albuterol [facility]) 108 [phone] Base) MCG/ACT inhaler Inhale 2 puffs into the lungs every 6 (six) hours as needed for wheezing or shortness of breath.    [provider]  cephALEXin (KEFLEX) 500 MG capsule Take 1 capsule (500 mg total) by mouth 4 (four) times daily for 5 days. 01/18/22 01/23/22  Eulogio Bear, NP  cetirizine (ZYRTEC) 10 MG tablet Take 10 mg by mouth daily.    [provider]  diclofenac sodium (VOLTAREN) 1 % GEL Apply 2 g topically daily as needed.    [provider]  fluticasone (FLONASE) 50 MCG/ACT nasal spray Place 2 sprays into both nostrils daily. 10/27/18   Collene Gobble, MD  losartan (COZAAR) 50 MG tablet Take 50 mg by mouth daily.    [provider]  pantoprazole (PROTONIX) 40 MG tablet Take 1 tablet (40 mg total) by mouth daily. 10/27/18   Collene Gobble, MD  pravastatin (PRAVACHOL) 40 MG tablet Take 40 mg by mouth daily.    [provider]  Rivaroxaban (XARELTO) 20 MG TABS Take 20 mg by mouth daily.    [provider]    Family History History reviewed. No pertinent family history.  Social History Social History   Tobacco Use   Smoking status: Former  Packs/day: 1.00    Years: 10.00    Total pack years: 10.00    Types: Cigarettes    Quit date: 05/04/1982    Years since quitting: 39.7   Smokeless tobacco: Never  Substance Use Topics   Alcohol use: Yes    Comment: occ   Drug use: No     Allergies   Percocet [oxycodone-acetaminophen] and Benzoyl peroxide   Review of Systems Review of Systems Per HPI  Physical Exam Triage Vital Signs ED Triage Vitals  Enc Vitals Group     BP 01/20/22 0925 (!) 154/106     Pulse Rate 01/20/22 0925 79     Resp 01/20/22 0925 16     Temp 01/20/22 0925 98.9 F (37.2 C)     Temp Source 01/20/22 0925 Oral     SpO2 01/20/22 0925 98 %     Weight --      Height --      Head Circumference --      Peak  Flow --      Pain Score 01/20/22 0924 6     Pain Loc --      Pain Edu? --      Excl. in [place]? --    No data found.  Updated Vital Signs BP (!) 154/106 (BP Location: Right Arm)   Pulse 79   Temp 98.9 F (37.2 C) (Oral)   Resp 16   SpO2 98%   Visual Acuity Right Eye Distance:   Left Eye Distance:   Bilateral Distance:    Right Eye Near:   Left Eye Near:    Bilateral Near:     Physical Exam Vitals and nursing note reviewed.  Constitutional:      Appearance: Normal appearance. She is not ill-appearing.  HENT:     Head: Atraumatic.  Eyes:     Extraocular Movements: Extraocular movements intact.     Conjunctiva/sclera: Conjunctivae normal.  Cardiovascular:     Rate and Rhythm: Normal rate and regular rhythm.     Heart sounds: Normal heart sounds.  Pulmonary:     Effort: Pulmonary effort is normal.     Breath sounds: Normal breath sounds.  Musculoskeletal:        General: Tenderness and signs of injury present. Normal range of motion.     Cervical back: Normal range of motion and neck supple.  Skin:    General: Skin is warm.     Comments: Multiple lacerations to the distal right index finger, bruising present within the wounds but no erythema, edema, fluctuance.  Neurological:     Mental Status: She is alert and oriented to person, place, and time.     Comments: Right hand neurovascularly intact  Psychiatric:        Mood and Affect: Mood normal.        Thought Content: Thought content normal.        Judgment: Judgment normal.      UC Treatments / Results  Labs (all labs ordered are listed, but only abnormal results are displayed) Labs Reviewed - No data to display  EKG   Radiology No results found.  Procedures Procedures (including critical care time)  Medications Ordered in UC Medications - No data to display  Initial Impression / Assessment and Plan / UC Course  I have reviewed the triage vital signs and the nursing notes.  Pertinent labs &  imaging results that were available during my care of the patient were reviewed by me and considered  in my medical decision making (see chart for details).     Currently on a course of Keflex to cover for infection and wound does not appear to be acutely infected at this time.  Complete this course, Hibiclens sent for daily cleaning and discussed nonstick dressings consistently until healed.  RICE protocol also reviewed.  Return with further concerns  Final Clinical Impressions(s) / UC Diagnoses   Final diagnoses:  Laceration of right index finger without foreign body without damage to nail, subsequent encounter     Discharge Instructions      Clean the wound daily with the Hibiclens solution and keep it dressed with nonstick gauze and Coban wrap.  Once the glue starts coming off, start using Neosporin after cleaning the wound.  Elevate your arm at rest, over-the-counter pain relievers as needed.  Return for significantly worsening pain, thick yellow or green drainage, redness and swelling to the surrounding areas of the finger or any other concerning findings.  Complete your full course of antibiotics    ED Prescriptions     Medication Sig Dispense Auth. Provider   chlorhexidine (HIBICLENS) 4 % external liquid Apply topically daily as needed. 120 mL Particia Nearing, [place]      PDMP not reviewed this encounter.   Roosvelt Maser [place], [place] 01/20/22 [phone]

## 2022-01-20 NOTE — ED Triage Notes (Signed)
Pt reports she wants the right index finger to be check, as she cut the finger with hedge trimmers 3 days ago. Reports right index finger has red drainage and pain. Pt has Dermabond on right index finger.

## 2022-01-20 NOTE — Discharge Instructions (Signed)
Clean the wound daily with the Hibiclens solution and keep it dressed with nonstick gauze and Coban wrap.  Once the glue starts coming off, start using Neosporin after cleaning the wound.  Elevate your arm at rest, over-the-counter pain relievers as needed.  Return for significantly worsening pain, thick yellow or green drainage, redness and swelling to the surrounding areas of the finger or any other concerning findings.  Complete your full course of antibiotics
